# Patient Record
Sex: Female | Born: 1970 | Race: White | Hispanic: No | Marital: Married | Smoking: Current every day smoker
Health system: Southern US, Community
[De-identification: ages and names within clinical notes are randomized; demographics above are authoritative.]

## PROBLEM LIST (undated history)

## (undated) ENCOUNTER — Ambulatory Visit: Admission: EM

## (undated) DIAGNOSIS — F4310 Post-traumatic stress disorder, unspecified: Secondary | ICD-10-CM

## (undated) DIAGNOSIS — F329 Major depressive disorder, single episode, unspecified: Secondary | ICD-10-CM

## (undated) DIAGNOSIS — F419 Anxiety disorder, unspecified: Secondary | ICD-10-CM

## (undated) DIAGNOSIS — F32A Depression, unspecified: Secondary | ICD-10-CM

## (undated) DIAGNOSIS — F319 Bipolar disorder, unspecified: Secondary | ICD-10-CM

## (undated) HISTORY — PX: ABDOMINAL SURGERY: SHX537

## (undated) HISTORY — PX: CHOLECYSTECTOMY: SHX55

## (undated) HISTORY — PX: KNEE SURGERY: SHX244

---

## 2005-02-25 ENCOUNTER — Emergency Department: Payer: Self-pay | Admitting: Internal Medicine

## 2005-10-05 ENCOUNTER — Emergency Department: Payer: Self-pay | Admitting: Emergency Medicine

## 2005-11-27 ENCOUNTER — Emergency Department: Payer: Self-pay | Admitting: Emergency Medicine

## 2006-03-26 ENCOUNTER — Emergency Department: Payer: Self-pay | Admitting: Emergency Medicine

## 2006-06-20 ENCOUNTER — Emergency Department: Payer: Self-pay | Admitting: Emergency Medicine

## 2006-06-25 ENCOUNTER — Emergency Department: Payer: Self-pay | Admitting: Emergency Medicine

## 2007-07-26 ENCOUNTER — Ambulatory Visit: Payer: Self-pay | Admitting: Family Medicine

## 2007-12-30 ENCOUNTER — Ambulatory Visit: Payer: Self-pay | Admitting: Family Medicine

## 2008-01-22 ENCOUNTER — Ambulatory Visit: Payer: Self-pay | Admitting: Family Medicine

## 2008-02-03 ENCOUNTER — Ambulatory Visit: Payer: Self-pay | Admitting: Gastroenterology

## 2008-02-07 ENCOUNTER — Ambulatory Visit: Payer: Self-pay | Admitting: Gastroenterology

## 2009-06-29 ENCOUNTER — Ambulatory Visit: Payer: Self-pay | Admitting: Internal Medicine

## 2010-01-21 ENCOUNTER — Ambulatory Visit: Payer: Self-pay | Admitting: Family Medicine

## 2011-06-13 ENCOUNTER — Emergency Department: Payer: Self-pay | Admitting: Emergency Medicine

## 2011-06-23 ENCOUNTER — Ambulatory Visit: Payer: Self-pay | Admitting: Physician Assistant

## 2011-07-04 ENCOUNTER — Ambulatory Visit: Payer: Self-pay | Admitting: Orthopedic Surgery

## 2011-09-04 ENCOUNTER — Ambulatory Visit: Payer: Self-pay | Admitting: Orthopedic Surgery

## 2011-09-19 ENCOUNTER — Ambulatory Visit: Payer: Self-pay | Admitting: Internal Medicine

## 2011-11-09 ENCOUNTER — Ambulatory Visit: Payer: Self-pay

## 2012-01-12 ENCOUNTER — Emergency Department: Payer: Self-pay | Admitting: Emergency Medicine

## 2012-01-12 LAB — DRUG SCREEN, URINE
Amphetamines, Ur Screen: NEGATIVE (ref ?–1000)
Barbiturates, Ur Screen: POSITIVE (ref ?–200)
Benzodiazepine, Ur Scrn: POSITIVE (ref ?–200)
Cocaine Metabolite,Ur ~~LOC~~: POSITIVE (ref ?–300)
MDMA (Ecstasy)Ur Screen: NEGATIVE (ref ?–500)
Methadone, Ur Screen: NEGATIVE (ref ?–300)
Tricyclic, Ur Screen: NEGATIVE (ref ?–1000)

## 2012-01-12 LAB — ACETAMINOPHEN LEVEL: Acetaminophen: <2 ug/mL

## 2012-01-12 LAB — TSH: Thyroid Stimulating Horm: 0.77 u[IU]/mL

## 2012-01-12 LAB — ETHANOL: Ethanol %: <0.003 % (ref 0.000–0.080)

## 2012-01-12 LAB — COMPREHENSIVE METABOLIC PANEL
Alkaline Phosphatase: 49 U/L — ABNORMAL LOW (ref 50–136)
Anion Gap: 6 — ABNORMAL LOW (ref 7–16)
Calcium, Total: 8.8 mg/dL (ref 8.5–10.1)
Co2: 27 mmol/L (ref 21–32)
EGFR (African American): >60
Osmolality: 275 (ref 275–301)
Potassium: 3.5 mmol/L (ref 3.5–5.1)
SGOT(AST): 17 U/L (ref 15–37)
Sodium: 138 mmol/L (ref 136–145)

## 2012-01-12 LAB — CBC
Platelet: 200 10*3/uL (ref 150–440)
RDW: 13.6 % (ref 11.5–14.5)
WBC: 8.3 10*3/uL (ref 3.6–11.0)

## 2012-01-12 LAB — SALICYLATE LEVEL: Salicylates, Serum: 3.9 mg/dL — ABNORMAL HIGH

## 2012-03-14 ENCOUNTER — Ambulatory Visit: Payer: Self-pay

## 2012-07-05 ENCOUNTER — Ambulatory Visit: Payer: Self-pay | Admitting: Internal Medicine

## 2012-12-11 ENCOUNTER — Emergency Department: Payer: Self-pay | Admitting: Emergency Medicine

## 2013-03-28 ENCOUNTER — Ambulatory Visit: Payer: Self-pay | Admitting: Family Medicine

## 2013-03-28 LAB — COMPREHENSIVE METABOLIC PANEL
Albumin: 4.9 g/dL (ref 3.4–5.0)
Alkaline Phosphatase: 74 U/L (ref 50–136)
BUN: 10 mg/dL (ref 7–18)
Calcium, Total: 9.6 mg/dL (ref 8.5–10.1)
Creatinine: 0.73 mg/dL (ref 0.60–1.30)
EGFR (African American): >60
Osmolality: 275 (ref 275–301)
Potassium: 4.7 mmol/L (ref 3.5–5.1)
SGOT(AST): 12 U/L — ABNORMAL LOW (ref 15–37)
Total Protein: 8.1 g/dL (ref 6.4–8.2)

## 2013-03-28 LAB — CBC WITH DIFFERENTIAL/PLATELET
Basophil %: 1.5 %
Eosinophil %: 1.1 %
HCT: 47.9 % — ABNORMAL HIGH (ref 35.0–47.0)
HGB: 16.2 g/dL — ABNORMAL HIGH (ref 12.0–16.0)
Lymphocyte #: 2.9 10*3/uL (ref 1.0–3.6)
Lymphocyte %: 32 %
MCH: 30.1 pg (ref 26.0–34.0)
MCHC: 33.8 g/dL (ref 32.0–36.0)
MCV: 89 fL (ref 80–100)
Monocyte #: 0.5 x10 3/mm (ref 0.2–0.9)
Monocyte %: 5.3 %
Neutrophil %: 60.1 %
RBC: 5.38 10*6/uL — ABNORMAL HIGH (ref 3.80–5.20)
RDW: 12.8 % (ref 11.5–14.5)
WBC: 9 10*3/uL (ref 3.6–11.0)

## 2013-03-28 LAB — SEDIMENTATION RATE: Erythrocyte Sed Rate: 2 mm/hr (ref 0–20)

## 2013-07-07 ENCOUNTER — Ambulatory Visit: Payer: Self-pay

## 2013-07-09 ENCOUNTER — Emergency Department: Payer: Self-pay | Admitting: Emergency Medicine

## 2013-07-09 LAB — URINALYSIS, COMPLETE
Nitrite: NEGATIVE
Protein: 30

## 2013-07-09 LAB — DRUG SCREEN, URINE
Benzodiazepine, Ur Scrn: POSITIVE (ref ?–200)
Cannabinoid 50 Ng, Ur ~~LOC~~: NEGATIVE (ref ?–50)
MDMA (Ecstasy)Ur Screen: NEGATIVE (ref ?–500)
Methadone, Ur Screen: NEGATIVE (ref ?–300)
Opiate, Ur Screen: POSITIVE (ref ?–300)

## 2013-07-09 LAB — CBC
HCT: 37.7 % (ref 35.0–47.0)
HGB: 12.9 g/dL (ref 12.0–16.0)
MCH: 30.6 pg (ref 26.0–34.0)
MCHC: 34.3 g/dL (ref 32.0–36.0)
Platelet: 185 10*3/uL (ref 150–440)
RBC: 4.22 10*6/uL (ref 3.80–5.20)
WBC: 8.6 10*3/uL (ref 3.6–11.0)

## 2013-07-09 LAB — COMPREHENSIVE METABOLIC PANEL
Albumin: 3.3 g/dL — ABNORMAL LOW (ref 3.4–5.0)
Anion Gap: 4 — ABNORMAL LOW (ref 7–16)
Bilirubin,Total: 0.3 mg/dL (ref 0.2–1.0)
Chloride: 105 mmol/L (ref 98–107)
Glucose: 89 mg/dL (ref 65–99)
Potassium: 3.5 mmol/L (ref 3.5–5.1)
SGOT(AST): 347 U/L — ABNORMAL HIGH (ref 15–37)
SGPT (ALT): 141 U/L — ABNORMAL HIGH (ref 12–78)
Sodium: 139 mmol/L (ref 136–145)

## 2013-10-06 ENCOUNTER — Ambulatory Visit: Payer: Self-pay | Admitting: Physician Assistant

## 2013-10-06 LAB — RAPID STREP-A WITH REFLX: Micro Text Report: NEGATIVE

## 2013-10-10 LAB — BETA STREP CULTURE(ARMC)

## 2014-02-01 ENCOUNTER — Emergency Department: Payer: Self-pay | Admitting: Emergency Medicine

## 2014-02-01 LAB — COMPREHENSIVE METABOLIC PANEL
ALT: 14 U/L (ref 12–78)
Albumin: 3.9 g/dL (ref 3.4–5.0)
Alkaline Phosphatase: 68 U/L
Anion Gap: 4 — ABNORMAL LOW (ref 7–16)
BUN: 7 mg/dL (ref 7–18)
Bilirubin,Total: 0.1 mg/dL — ABNORMAL LOW (ref 0.2–1.0)
CALCIUM: 8.6 mg/dL (ref 8.5–10.1)
CREATININE: 0.77 mg/dL (ref 0.60–1.30)
Chloride: 107 mmol/L (ref 98–107)
Co2: 28 mmol/L (ref 21–32)
EGFR (Non-African Amer.): >60
GLUCOSE: 124 mg/dL — AB (ref 65–99)
Osmolality: 277 (ref 275–301)
POTASSIUM: 4.1 mmol/L (ref 3.5–5.1)
SGOT(AST): 20 U/L (ref 15–37)
SODIUM: 139 mmol/L (ref 136–145)
TOTAL PROTEIN: 7.3 g/dL (ref 6.4–8.2)

## 2014-02-01 LAB — URINALYSIS, COMPLETE
BILIRUBIN, UR: NEGATIVE
Bacteria: NONE SEEN
GLUCOSE, UR: NEGATIVE mg/dL (ref 0–75)
Ketone: NEGATIVE
LEUKOCYTE ESTERASE: NEGATIVE
NITRITE: NEGATIVE
PROTEIN: NEGATIVE
Ph: 6 (ref 4.5–8.0)
RBC,UR: 5 /HPF (ref 0–5)
Specific Gravity: 1.017 (ref 1.003–1.030)
Squamous Epithelial: <1

## 2014-02-01 LAB — CBC
HCT: 41 % (ref 35.0–47.0)
HGB: 13.5 g/dL (ref 12.0–16.0)
MCH: 30 pg (ref 26.0–34.0)
MCHC: 32.8 g/dL (ref 32.0–36.0)
MCV: 91 fL (ref 80–100)
PLATELETS: 228 10*3/uL (ref 150–440)
RBC: 4.49 10*6/uL (ref 3.80–5.20)
RDW: 13.9 % (ref 11.5–14.5)
WBC: 9.4 10*3/uL (ref 3.6–11.0)

## 2014-02-01 LAB — DRUG SCREEN, URINE
Amphetamines, Ur Screen: NEGATIVE (ref ?–1000)
BENZODIAZEPINE, UR SCRN: POSITIVE (ref ?–200)
Barbiturates, Ur Screen: POSITIVE (ref ?–200)
CANNABINOID 50 NG, UR ~~LOC~~: NEGATIVE (ref ?–50)
Cocaine Metabolite,Ur ~~LOC~~: POSITIVE (ref ?–300)
MDMA (Ecstasy)Ur Screen: NEGATIVE (ref ?–500)
Methadone, Ur Screen: NEGATIVE (ref ?–300)
Opiate, Ur Screen: POSITIVE (ref ?–300)
PHENCYCLIDINE (PCP) UR S: NEGATIVE (ref ?–25)
TRICYCLIC, UR SCREEN: NEGATIVE (ref ?–1000)

## 2014-02-01 LAB — SALICYLATE LEVEL: Salicylates, Serum: 5.3 mg/dL — ABNORMAL HIGH

## 2014-02-01 LAB — ACETAMINOPHEN LEVEL: Acetaminophen: 13 ug/mL

## 2014-02-01 LAB — ETHANOL
Ethanol %: <0.003 % (ref 0.000–0.080)
Ethanol: <3 mg/dL

## 2014-03-25 ENCOUNTER — Ambulatory Visit: Payer: Self-pay | Admitting: Internal Medicine

## 2014-04-14 ENCOUNTER — Ambulatory Visit: Payer: Self-pay | Admitting: Emergency Medicine

## 2015-02-04 ENCOUNTER — Emergency Department
Admit: 2015-02-04 | Disposition: A | Discharge status: Home / Self Care | Payer: Self-pay | Admitting: Emergency Medicine

## 2015-02-04 LAB — DRUG SCREEN, URINE
Amphetamines, Ur Screen: NEGATIVE
Barbiturates, Ur Screen: NEGATIVE
Benzodiazepine, Ur Scrn: POSITIVE
Cannabinoid 50 Ng, Ur ~~LOC~~: NEGATIVE
Cocaine Metabolite,Ur ~~LOC~~: POSITIVE
MDMA (Ecstasy)Ur Screen: NEGATIVE
Methadone, Ur Screen: NEGATIVE
Opiate, Ur Screen: POSITIVE
Phencyclidine (PCP) Ur S: NEGATIVE
Tricyclic, Ur Screen: POSITIVE

## 2015-02-04 LAB — COMPREHENSIVE METABOLIC PANEL
AST: 18 U/L
Albumin: 4.8 g/dL
Alkaline Phosphatase: 65 U/L
Anion Gap: 8 (ref 7–16)
BILIRUBIN TOTAL: 0.3 mg/dL
BUN: 9 mg/dL
CHLORIDE: 105 mmol/L
Calcium, Total: 8.8 mg/dL — ABNORMAL LOW
Co2: 28 mmol/L
Creatinine: 0.77 mg/dL
EGFR (African American): >60
Glucose: 113 mg/dL — ABNORMAL HIGH
Potassium: 4 mmol/L
SGPT (ALT): 13 U/L — ABNORMAL LOW
SODIUM: 141 mmol/L
TOTAL PROTEIN: 7.6 g/dL

## 2015-02-04 LAB — URINALYSIS, COMPLETE
Bilirubin,UR: NEGATIVE
Blood: NEGATIVE
GLUCOSE, UR: NEGATIVE mg/dL (ref 0–75)
Ketone: NEGATIVE
LEUKOCYTE ESTERASE: NEGATIVE
NITRITE: NEGATIVE
Ph: 6 (ref 4.5–8.0)
Protein: 30
Specific Gravity: 1.02 (ref 1.003–1.030)

## 2015-02-04 LAB — CBC
HCT: 43.2 % (ref 35.0–47.0)
HGB: 14.3 g/dL (ref 12.0–16.0)
MCH: 30 pg (ref 26.0–34.0)
MCHC: 33 g/dL (ref 32.0–36.0)
MCV: 91 fL (ref 80–100)
Platelet: 216 10*3/uL (ref 150–440)
RBC: 4.75 10*6/uL (ref 3.80–5.20)
RDW: 13.6 % (ref 11.5–14.5)
WBC: 8.6 10*3/uL (ref 3.6–11.0)

## 2015-02-04 LAB — ACETAMINOPHEN LEVEL: Acetaminophen: <10 ug/mL

## 2015-02-04 LAB — ETHANOL: Ethanol: <5 mg/dL

## 2015-02-04 LAB — TSH: Thyroid Stimulating Horm: 1.02 u[IU]/mL

## 2015-02-04 LAB — SALICYLATE LEVEL: Salicylates, Serum: <4 mg/dL

## 2015-02-13 NOTE — Consult Note (Signed)
Brief Consult Note: Diagnosis: Major depressive disorder recurrent moderate.   Patient was seen by consultant.   Consult note dictated.   Recommend further assessment or treatment.   Comments: [name] has a h/o depression and anxiety. She is in the care od PCP at [facility] who prescribes Paxil and clonazepam. She admits that she has problems with "pills". She took some pills when upset with her husband who has an affair. She is not suicidal or homicidal. She is cool and collected.   PLAN: 1. The patient no longer meets criteria for IVC. I will terminate proceedings. Please discharge as appropriate.  2. She is to continue Paxil as prescribed by her provider. No Rx necessary.  3. She will follow up with Dr. Marianna FussMichael Larter [phone] for therapy.  Electronic Signatures: Kristine LineaPucilowska, Victorina Kable (MD)  (Signed 13-Apr-15 13:47)  Authored: Brief Consult Note   Last Updated: 13-Apr-15 13:47 by Kristine LineaPucilowska, Terrika Zuver (MD)
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
## 2015-02-21 NOTE — Consult Note (Addendum)
PATIENT NAME:  Molly Proctor, Molly Proctor MR#:  [id number] DATE OF BIRTH:  1971/01/28  DATE OF CONSULTATION:  02/05/2015  CONSULTING PHYSICIAN:  Audery AmelJohn T. Amor Hyle, MD  IDENTIFYING INFORMATION AND REASON FOR CONSULT:  A 44 year old woman with a history of depression and substance abuse came into the hospital after taking what appeared to be an overdose.   CHIEF COMPLAINT:  "I don't remember."   HISTORY OF PRESENT ILLNESS:  Information is from the patient and the chart.  I attempted to see her yesterday, but she was so sedated, I could get only a little bit of information from her. When I saw her yesterday, she did admit that she had taken an overdose of pills.  Today she claims she cannot remember at all why she is in the Emergency Room.  When I pointed out that she had admitted to taking an overdose of pills, she says that was probably right, but she still cannot remember it.  She says she figures she probably took some of her Klonopin.  She has no justification for it except to say that sometimes you just feel like taking some pills.  She says that her mood recently has been feeling a little bit down and depressed.  She has been sleeping somewhat erratically.  She totally denies however having any suicidal intention, plan, or wish. Denies homicidal ideation.  She denies that she has been having any psychotic symptoms.  Says that she has been compliant with her medicine as prescribed by her primary [facility] at [facility] which is a combination of Paxil, Tegretol and Klonopin.  The patient just got out of prison and is living in a mobile home.  Not working.  It sounds like she is still at a lot of stress on her.  She admits that she does cocaine intermittently.  Despite all of this, she tells me that she does not see herself as having any kind of substance abuse problem.  She has not reported suicidal ideation.   PAST PSYCHIATRIC HISTORY:  Interestingly, it was exactly a year ago that she presented to the Emergency Room  under identical circumstances having overdosed on what appeared to be a combination of pills.  The patient does not know of any anniversary reason why this would be happening.  She apparently does not engage herself in treatment of substance abuse but only seeing a doctor for treatment of her depression.  She denies suicide attempts.  Denies psychotic symptoms.   SOCIAL HISTORY:  Just out of prison.  Living in a trailer.  Not working.  It sounds like she has minimal support and a lot of stress.   PAST MEDICAL HISTORY:  The patient says she has headaches and takes Fioricet for them as needed.  Denies other ongoing medical problems.   FAMILY HISTORY:  She says there are multiple family members with bipolar disorder.   CURRENT MEDICATIONS:  Paxil, Tegretol and Klonopin all at unknown doses to her.   ALLERGIES: No known drug allergies.   REVIEW OF SYSTEMS:  Currently, she has a mild headache. Does not have any other pain.  Denies GI, cardiac, or pulmonary problems.  Denies feeling depressed.  Denies suicidal ideation or hallucinations.   MENTAL STATUS EXAMINATION: Very dirty and disheveled woman who looks older than her stated age.  Passively cooperative.  Eye contact intermittent.  Psychomotor activity is sluggish.  Speech is decreased in amount and quiet.  Affect flat.  Mood stated as okay.  Thoughts appear slow in lucid but  no sign of delusions or loosening of association.  Denies auditory or visual hallucinations.  Denies suicidal or homicidal ideation.  She is alert and oriented x 4. Repeats three words immediately, remembers two of them at three minutes.  Judgment and insight impaired about her substance abuse.  Intelligence normal.   EKG, LABORATORY AND IMAGING RESULTS:  EKG was unremarkable.  Acetaminophen level negative.  Glucose slightly elevated at 113, ALT low at 13.  The rest of the chemistry panel normal.  Alcohol negative.  CBC normal.  TSH normal.  Drug screen was positive for tricyclics,  cocaine, opiates, and for benzodiazepines.  Urinalysis unremarkable.  Head CT normal.   VITAL SIGNS:  Blood pressure is currently 127/94, respirations 18, pulse 82, temperature 98.8.   ASSESSMENT:  A 44 year old woman with a history of depression and a history of overdoses and clearly history of active ongoing substance abuse although she has no insight about it.  She took an overdose yesterday but never admitted to any suicidal intent.  Currently is denying suicidal ideation.  She is not psychotic.  She is minimizing depressive symptoms.  No clear necessary indication for hospital level treatment.   TREATMENT PLAN:  Discontinue involuntary commitment.  Psychoeducation and supportive counseling done regarding her substance abuse.  Strongly encouraged her to talk to her primary doctor about getting off of any prescribed abusable medicines including Fioricet and Klonopin. Strongly encouraged her to go to substance abuse treatment and take it seriously if she wants her mood to get better.  The case discussed with Emergency Room doctor.  No further treatment right now.   DIAGNOSIS, PRINCIPAL AND PRIMARY: AXIS I:  Delirium, secondary sedative intoxication now resolved.   SECONDARY DIAGNOSES:  1.  Sedative abuse.  2.  Opiate abuse.  3.  Cocaine abuse.   AXIS II:  Deferred.   AXIS III:  No diagnosis.       ____________________________ Audery Amel, MD [id number] D: 02/05/2015 14:25:35 ET T: 02/05/2015 14:37:47 ET JOB#: [id number]  cc: Audery Amel, MD, <Dictator> Audery Amel MD ELECTRONICALLY SIGNED 02/26/2015 19:27

## 2015-04-02 ENCOUNTER — Emergency Department
Admission: EM | Admit: 2015-04-02 | Discharge: 2015-04-02 | Disposition: A | Discharge status: Home / Self Care | Attending: Emergency Medicine | Admitting: Emergency Medicine

## 2015-04-02 ENCOUNTER — Encounter: Payer: Self-pay | Admitting: Emergency Medicine

## 2015-04-02 DIAGNOSIS — G8929 Other chronic pain: Secondary | ICD-10-CM | POA: Insufficient documentation

## 2015-04-02 DIAGNOSIS — M2550 Pain in unspecified joint: Secondary | ICD-10-CM | POA: Insufficient documentation

## 2015-04-02 DIAGNOSIS — Z720 Tobacco use: Secondary | ICD-10-CM | POA: Insufficient documentation

## 2015-04-02 HISTORY — DX: Depression, unspecified: F32.A

## 2015-04-02 HISTORY — DX: Major depressive disorder, single episode, unspecified: F32.9

## 2015-04-02 LAB — CBC WITH DIFFERENTIAL/PLATELET
Basophils Absolute: 0.1 10*3/uL (ref 0–0.1)
Basophils Relative: 1 %
EOS ABS: 0.2 10*3/uL (ref 0–0.7)
EOS PCT: 2 %
HEMATOCRIT: 42.3 % (ref 35.0–47.0)
Hemoglobin: 14.5 g/dL (ref 12.0–16.0)
Lymphocytes Relative: 35 %
Lymphs Abs: 3 10*3/uL (ref 1.0–3.6)
MCH: 31.1 pg (ref 26.0–34.0)
MCHC: 34.4 g/dL (ref 32.0–36.0)
MCV: 90.6 fL (ref 80.0–100.0)
MONO ABS: 0.3 10*3/uL (ref 0.2–0.9)
MONOS PCT: 4 %
NEUTROS PCT: 58 %
Neutro Abs: 5.1 10*3/uL (ref 1.4–6.5)
Platelets: 229 10*3/uL (ref 150–440)
RBC: 4.67 MIL/uL (ref 3.80–5.20)
RDW: 14 % (ref 11.5–14.5)
WBC: 8.6 10*3/uL (ref 3.6–11.0)

## 2015-04-02 LAB — COMPREHENSIVE METABOLIC PANEL
ALT: 12 U/L — ABNORMAL LOW (ref 14–54)
AST: 18 U/L (ref 15–41)
Albumin: 4.6 g/dL (ref 3.5–5.0)
Alkaline Phosphatase: 58 U/L (ref 38–126)
Anion gap: 8 (ref 5–15)
BILIRUBIN TOTAL: 0.4 mg/dL (ref 0.3–1.2)
BUN: 11 mg/dL (ref 6–20)
CHLORIDE: 105 mmol/L (ref 101–111)
CO2: 28 mmol/L (ref 22–32)
CREATININE: 0.87 mg/dL (ref 0.44–1.00)
Calcium: 9.1 mg/dL (ref 8.9–10.3)
GFR calc Af Amer: >60 mL/min (ref >60)
GLUCOSE: 97 mg/dL (ref 65–99)
Potassium: 3.1 mmol/L — ABNORMAL LOW (ref 3.5–5.1)
Sodium: 141 mmol/L (ref 135–145)
Total Protein: 7.3 g/dL (ref 6.5–8.1)

## 2015-04-02 MED ORDER — KETOROLAC TROMETHAMINE 60 MG/2ML IM SOLN
INTRAMUSCULAR | Status: AC
  Administered 2015-04-02: 60 mg via INTRAMUSCULAR
  Filled 2015-04-02: qty 2

## 2015-04-02 MED ORDER — KETOROLAC TROMETHAMINE 60 MG/2ML IM SOLN
60.0000 mg | Freq: Once | INTRAMUSCULAR | Status: AC
  Administered 2015-04-02: 60 mg via INTRAMUSCULAR

## 2015-04-02 MED ORDER — IBUPROFEN 800 MG PO TABS: 800.0000 mg | ORAL_TABLET | Freq: Three times a day (TID) | ORAL | Status: DC | PRN

## 2015-04-02 NOTE — ED Notes (Signed)
Pt to ed with c/o joint pain x 1 1/2 weeks, also reports increased bruising noted over her body.  Pt appears in nad at this time.  Pt alert and oriented.  Pt states "everything is just killing me"

## 2015-04-02 NOTE — ED Provider Notes (Signed)
[facility] Emergency Department Provider Note     Time seen: ----------------------------------------- 7:49 AM on 04/02/2015 -----------------------------------------    I have reviewed the triage vital signs and the nursing notes.   HISTORY  Chief Complaint Joint Pain    HPI Molly Proctor is a 44 y.o. female since ER for joint pain for last week and half. Patient reports joint pains for years, up to 3 years ago. Patient states everyone in her family has some type of arthritis, she's never been formally diagnosed with any specific type of arthritis. She denies fevers chills chest pain, shortness of breath, nausea vomiting or diarrhea. She states she's never been prescribed medications for this joint pain. Patient said he was seen years ago by Dr. Kateri Mc but not given a diagnosis. Pain is severe, nothing makes it better or worse   Past Medical History  Diagnosis Date  . Depression     There are no active problems to display for this patient.   Past Surgical History  Procedure Laterality Date  . Knee surgery      Allergies Review of patient's allergies indicates no known allergies.  Social History History  Substance Use Topics  . Smoking status: Current Every Day Smoker  . Smokeless tobacco: Not on file  . Alcohol Use: No    Review of Systems Constitutional: Negative for fever. Eyes: Negative for visual changes. ENT: Negative for sore throat. Cardiovascular: Negative for chest pain. Respiratory: Negative for shortness of breath. Gastrointestinal: Negative for abdominal pain, vomiting and diarrhea. Genitourinary: Negative for dysuria. Musculoskeletal: Negative for back pain. Diffuse bilateral joint pain in all large joints Skin: Negative for rash. Neurological: Negative for headaches, focal weakness or numbness.  10-point ROS otherwise negative.  ____________________________________________   PHYSICAL EXAM:  VITAL SIGNS: ED Triage  Vitals  Enc Vitals Group     BP 04/02/15 0716 123/79 mmHg     Pulse Rate 04/02/15 0716 84     Resp 04/02/15 0716 18     Temp 04/02/15 0716 98.2 F (36.8 C)     Temp Source 04/02/15 0716 Oral     SpO2 04/02/15 0716 98 %     Weight 04/02/15 0716 156 lb (70.761 kg)     Height 04/02/15 0716  (1.676 m)     Head Cir --      Peak Flow --      Pain Score 04/02/15 0716 8     Pain Loc --      Pain Edu? --      Excl. in GC? --     Constitutional: Alert and oriented. Well appearing and in no distress. Eyes: Conjunctivae are normal. PERRL. Normal extraocular movements. ENT   Head: Normocephalic and atraumatic.   Nose: No congestion/rhinnorhea.   Mouth/Throat: Mucous membranes are moist.   Neck: No stridor. Hematological/Lymphatic/Immunilogical: No cervical lymphadenopathy. Cardiovascular: Normal rate, regular rhythm. Normal and symmetric distal pulses are present in all extremities. No murmurs, rubs, or gallops. Respiratory: Normal respiratory effort without tachypnea nor retractions. Breath sounds are clear and equal bilaterally. No wheezes/rales/rhonchi. Gastrointestinal: Soft and nontender. No distention. No abdominal bruits. There is no CVA tenderness. Musculoskeletal: Nontender with normal range of motion in all extremities. No joint effusions.  No lower extremity tenderness nor edema. Neurologic:  Normal speech and language. No gross focal neurologic deficits are appreciated. Speech is normal. No gait instability. Skin:  Skin is warm, dry and intact. No rash noted. Psychiatric: Mood and affect are normal. Speech and  behavior are normal. Patient exhibits appropriate insight and judgment. ____________________________________________  ED COURSE:  Pertinent labs & imaging results that were available during my care of the patient were reviewed by me and considered in my medical decision making (see chart for details). I will check basic labs including rheumatoid factor and  antinuclear antibody ____________________________________________    LABS (pertinent positives/negatives)  Labs Reviewed  COMPREHENSIVE METABOLIC PANEL - Abnormal; Notable for the following:    Potassium 3.1 (*)    ALT 12 (*)    All other components within normal limits  CBC WITH DIFFERENTIAL/PLATELET  URINALYSIS COMPLETEWITH MICROSCOPIC ([facility] ONLY)  RHEUMATOID FACTOR  ANTINUCLEAR ANTIBODIES, IFA    RADIOLOGY  None  ____________________________________________  FINAL ASSESSMENT AND PLAN  Arthralgia  Plan: Patient presents with chronic symptoms, advised we do not manage chronic pain in the ER. Encouraged Motrin as needed for symptoms. She stable for outpatient follow-up.   Emily Filbert, MD   Emily Filbert, MD 04/02/15 [phone]

## 2015-04-02 NOTE — Discharge Instructions (Signed)

## 2015-04-02 NOTE — ED Notes (Signed)
Pt found laying in room 12 when she is suppose to be in 11.the patient is a/ox3.the patient returned to rm11. Informed of waiting for results.

## 2015-04-04 LAB — RHEUMATOID FACTOR: RHEUMATOID FACTOR: 9.2 [IU]/mL (ref 0.0–13.9)

## 2015-04-04 LAB — ANTINUCLEAR ANTIBODIES, IFA: ANA Ab, IFA: NEGATIVE

## 2015-06-29 ENCOUNTER — Ambulatory Visit: Admit: 2015-06-29

## 2015-06-29 ENCOUNTER — Encounter: Payer: Self-pay | Admitting: Emergency Medicine

## 2015-06-29 ENCOUNTER — Ambulatory Visit
Admission: EM | Admit: 2015-06-29 | Discharge: 2015-06-29 | Disposition: A | Discharge status: Home / Self Care | Attending: Family Medicine | Admitting: Family Medicine

## 2015-06-29 DIAGNOSIS — A499 Bacterial infection, unspecified: Secondary | ICD-10-CM

## 2015-06-29 DIAGNOSIS — N760 Acute vaginitis: Secondary | ICD-10-CM | POA: Diagnosis not present

## 2015-06-29 DIAGNOSIS — Z9889 Other specified postprocedural states: Secondary | ICD-10-CM

## 2015-06-29 DIAGNOSIS — B373 Candidiasis of vulva and vagina: Secondary | ICD-10-CM | POA: Diagnosis not present

## 2015-06-29 DIAGNOSIS — N730 Acute parametritis and pelvic cellulitis: Secondary | ICD-10-CM | POA: Diagnosis not present

## 2015-06-29 DIAGNOSIS — B9689 Other specified bacterial agents as the cause of diseases classified elsewhere: Secondary | ICD-10-CM

## 2015-06-29 DIAGNOSIS — B3731 Acute candidiasis of vulva and vagina: Secondary | ICD-10-CM

## 2015-06-29 HISTORY — DX: Post-traumatic stress disorder, unspecified: F43.10

## 2015-06-29 HISTORY — DX: Anxiety disorder, unspecified: F41.9

## 2015-06-29 HISTORY — DX: Bipolar disorder, unspecified: F31.9

## 2015-06-29 LAB — URINALYSIS COMPLETE WITH MICROSCOPIC (ARMC ONLY)
Bacteria, UA: NONE SEEN — AB
Bilirubin Urine: NEGATIVE
GLUCOSE, UA: NEGATIVE mg/dL
Ketones, ur: NEGATIVE mg/dL
LEUKOCYTES UA: NEGATIVE
Nitrite: NEGATIVE
Protein, ur: NEGATIVE mg/dL
SPECIFIC GRAVITY, URINE: 1.02 (ref 1.005–1.030)
pH: 6 (ref 5.0–8.0)

## 2015-06-29 LAB — CBC WITH DIFFERENTIAL/PLATELET
Basophils Absolute: 0.1 10*3/uL (ref 0–0.1)
Basophils Relative: 1 %
Eosinophils Absolute: 0.1 10*3/uL (ref 0–0.7)
Eosinophils Relative: 2 %
HEMATOCRIT: 42.5 % (ref 35.0–47.0)
HEMOGLOBIN: 14.6 g/dL (ref 12.0–16.0)
LYMPHS ABS: 3.2 10*3/uL (ref 1.0–3.6)
LYMPHS PCT: 42 %
MCH: 32.1 pg (ref 26.0–34.0)
MCHC: 34.4 g/dL (ref 32.0–36.0)
MCV: 93.3 fL (ref 80.0–100.0)
MONOS PCT: 6 %
Monocytes Absolute: 0.4 10*3/uL (ref 0.2–0.9)
NEUTROS ABS: 3.7 10*3/uL (ref 1.4–6.5)
NEUTROS PCT: 49 %
Platelets: 240 10*3/uL (ref 150–440)
RBC: 4.55 MIL/uL (ref 3.80–5.20)
RDW: 13.3 % (ref 11.5–14.5)
WBC: 7.5 10*3/uL (ref 3.6–11.0)

## 2015-06-29 LAB — COMPREHENSIVE METABOLIC PANEL
ALT: 15 U/L (ref 14–54)
ANION GAP: 10 (ref 5–15)
AST: 19 U/L (ref 15–41)
Albumin: 4.9 g/dL (ref 3.5–5.0)
Alkaline Phosphatase: 61 U/L (ref 38–126)
BUN: 12 mg/dL (ref 6–20)
CO2: 28 mmol/L (ref 22–32)
Calcium: 9.1 mg/dL (ref 8.9–10.3)
Chloride: 100 mmol/L — ABNORMAL LOW (ref 101–111)
Creatinine, Ser: 0.71 mg/dL (ref 0.44–1.00)
GFR calc non Af Amer: >60 mL/min (ref >60)
Glucose, Bld: 96 mg/dL (ref 65–99)
Potassium: 3.8 mmol/L (ref 3.5–5.1)
SODIUM: 138 mmol/L (ref 135–145)
Total Bilirubin: 0.6 mg/dL (ref 0.3–1.2)
Total Protein: 7.7 g/dL (ref 6.5–8.1)

## 2015-06-29 LAB — WET PREP, GENITAL
TRICH WET PREP: NONE SEEN
YEAST WET PREP: NONE SEEN

## 2015-06-29 LAB — PREGNANCY, URINE: PREG TEST UR: NEGATIVE

## 2015-06-29 MED ORDER — CEFTRIAXONE SODIUM 1 G IJ SOLR
1.0000 g | Freq: Once | INTRAMUSCULAR | Status: AC
  Administered 2015-06-29: 1 g via INTRAMUSCULAR

## 2015-06-29 MED ORDER — TERCONAZOLE 80 MG VA SUPP: 80.0000 mg | Freq: Every day | VAGINAL | Status: DC

## 2015-06-29 MED ORDER — METRONIDAZOLE 500 MG PO TABS: 500.0000 mg | ORAL_TABLET | Freq: Two times a day (BID) | ORAL | Status: DC

## 2015-06-29 MED ORDER — DOXYCYCLINE HYCLATE 100 MG PO CAPS: 100.0000 mg | ORAL_CAPSULE | Freq: Two times a day (BID) | ORAL | Status: DC

## 2015-06-29 MED ORDER — KETOROLAC TROMETHAMINE 60 MG/2ML IM SOLN
60.0000 mg | Freq: Once | INTRAMUSCULAR | Status: AC
  Administered 2015-06-29: 60 mg via INTRAMUSCULAR

## 2015-06-29 MED ORDER — HYDROCODONE-ACETAMINOPHEN 5-325 MG PO TABS: 1.0000 | ORAL_TABLET | Freq: Three times a day (TID) | ORAL | Status: DC | PRN
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
## 2015-06-29 NOTE — ED Notes (Signed)
Patient scheduled for Pelvic US and Non OB Transvaginal US for today at [facility].
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
## 2015-06-29 NOTE — Discharge Instructions (Signed)
Bacterial Vaginosis Bacterial vaginosis is an infection of the vagina. It happens when too many of certain germs (bacteria) grow in the vagina. HOME CARE  Take your medicine as told by your doctor.  Finish your medicine even if you start to feel better.  Do not have sex until you finish your medicine and are better.  Tell your sex partner that you have an infection. They should see their doctor for treatment.  Practice safe sex. Use condoms. Have only one sex partner. GET HELP IF:  You are not getting better after 3 days of treatment.  You have more grey fluid (discharge) coming from your vagina than before.  You have more pain than before.  You have a fever. MAKE SURE YOU:   Understand these instructions.  Will watch your condition.  Will get help right away if you are not doing well or get worse. Document Released: 07/18/2008 Document Revised: 07/30/2013 Document Reviewed: 05/21/2013 [facility] Patient Information 2015 [place], [place]. This information is not intended to replace advice given to you by your health care provider. Make sure you discuss any questions you have with your health care provider.  Candidal Vulvovaginitis Candidal vulvovaginitis is an infection of the vagina and vulva. The vulva is the skin around the opening of the vagina. This may cause itching and discomfort in and around the vagina.  HOME CARE  Only take medicine as told by your doctor.  Do not have sex (intercourse) until the infection is healed or as told by your doctor.  Practice safe sex.  Tell your sex partner about your infection.  Do not douche or use tampons.  Wear cotton underwear. Do not wear tight pants or panty hose.  Eat yogurt. This may help treat and prevent yeast infections. GET HELP RIGHT AWAY IF:   You have a fever.  Your problems get worse during treatment or do not get better in 3 days.  You have discomfort, irritation, or itching in your vagina or vulva  area.  You have pain after sex.  You start to get belly (abdominal) pain. MAKE SURE YOU:  Understand these instructions.  Will watch your condition.  Will get help right away if you are not doing well or get worse. Document Released: 01/05/2009 Document Revised: 10/14/2013 Document Reviewed: 01/05/2009 [facility] Patient Information 2015 [place], [place]. This information is not intended to replace advice given to you by your health care provider. Make sure you discuss any questions you have with your health care provider.  Pelvic Inflammatory Disease Pelvic inflammatory disease (PID) is an infection in some or all of the female organs. PID can be in the uterus, ovaries, fallopian tubes, or the surrounding tissues inside the lower belly area (pelvis). HOME CARE   If given, take your antibiotic medicine as told. Finish them even if you start to feel better.  Only take medicine as told by your doctor.  Do not have sex (intercourse) until treatment is done or as told by your doctor.  Tell your sex partner if you have PID. Your partner may need to be treated.  Keep all doctor visits. GET HELP RIGHT AWAY IF:   You have a fever.  You have more belly (abdominal) or lower belly pain.  You have chills.  You have pain when you pee (urinate).  You are not better after 72 hours.  You have more fluid (discharge) coming from your vagina or fluid that is not normal.  You need pain medicine from your doctor.  You  throw up (vomit).  You cannot take your medicines.  Your partner has a sexually transmitted disease (STD). MAKE SURE YOU:   Understand these instructions.  Will watch your condition.  Will get help right away if you are not doing well or get worse. Document Released: 01/05/2009 Document Revised: 02/03/2013 Document Reviewed: 10/05/2011 [facility] Patient Information 2015 [place], [place]. This information is not intended to replace advice given to you by your health care  provider. Make sure you discuss any questions you have with your health care provider.  Monilial Vaginitis Vaginitis in a soreness, swelling and redness (inflammation) of the vagina and vulva. Monilial vaginitis is not a sexually transmitted infection. CAUSES  Yeast vaginitis is caused by yeast (candida) that is normally found in your vagina. With a yeast infection, the candida has overgrown in number to a point that upsets the chemical balance. SYMPTOMS   White, thick vaginal discharge.  Swelling, itching, redness and irritation of the vagina and possibly the lips of the vagina (vulva).  Burning or painful urination.  Painful intercourse. DIAGNOSIS  Things that may contribute to monilial vaginitis are:  Postmenopausal and virginal states.  Pregnancy.  Infections.  Being tired, sick or stressed, especially if you had monilial vaginitis in the past.  Diabetes. Good control will help lower the chance.  Birth control pills.  Tight fitting garments.  Using bubble bath, feminine sprays, douches or deodorant tampons.  Taking certain medications that kill germs (antibiotics).  Sporadic recurrence can occur if you become ill. TREATMENT  Your caregiver will give you medication.  There are several kinds of anti monilial vaginal creams and suppositories specific for monilial vaginitis. For recurrent yeast infections, use a suppository or cream in the vagina 2 times a week, or as directed.  Anti-monilial or steroid cream for the itching or irritation of the vulva may also be used. Get your caregiver's permission.  Painting the vagina with methylene blue solution may help if the monilial cream does not work.  Eating yogurt may help prevent monilial vaginitis. HOME CARE INSTRUCTIONS   Finish all medication as prescribed.  Do not have sex until treatment is completed or after your caregiver tells you it is okay.  Take warm sitz baths.  Do not douche.  Do not use tampons,  especially scented ones.  Wear cotton underwear.  Avoid tight pants and panty hose.  Tell your sexual partner that you have a yeast infection. They should go to their caregiver if they have symptoms such as mild rash or itching.  Your sexual partner should be treated as well if your infection is difficult to eliminate.  Practice safer sex. Use condoms.  Some vaginal medications cause latex condoms to fail. Vaginal medications that harm condoms are:  Cleocin cream.  Butoconazole (Femstat).  Terconazole (Terazol) vaginal suppository.  Miconazole (Monistat) (may be purchased over the counter). SEEK MEDICAL CARE IF:   You have a temperature by mouth above 102 F (38.9 C).  The infection is getting worse after 2 days of treatment.  The infection is not getting better after 3 days of treatment.  You develop blisters in or around your vagina.  You develop vaginal bleeding, and it is not your menstrual period.  You have pain when you urinate.  You develop intestinal problems.  You have pain with sexual intercourse. Document Released: 07/19/2005 Document Revised: 01/01/2012 Document Reviewed: 04/02/2009 [facility] Patient Information 2015 [place], [place]. This information is not intended to replace advice given to you by your health care provider.  Make sure you discuss any questions you have with your health care provider. ° °

## 2015-06-30 LAB — CHLAMYDIA/NGC RT PCR (ARMC ONLY)
Chlamydia Tr: NOT DETECTED
N GONORRHOEAE: NOT DETECTED

## 2015-12-14 ENCOUNTER — Emergency Department

## 2015-12-14 ENCOUNTER — Emergency Department
Admission: EM | Admit: 2015-12-14 | Discharge: 2015-12-15 | Disposition: A | Discharge status: Home / Self Care | Attending: Emergency Medicine | Admitting: Emergency Medicine

## 2015-12-14 DIAGNOSIS — F1310 Sedative, hypnotic or anxiolytic abuse, uncomplicated: Secondary | ICD-10-CM

## 2015-12-14 DIAGNOSIS — R462 Strange and inexplicable behavior: Secondary | ICD-10-CM

## 2015-12-14 DIAGNOSIS — Y9289 Other specified places as the place of occurrence of the external cause: Secondary | ICD-10-CM | POA: Insufficient documentation

## 2015-12-14 DIAGNOSIS — F4310 Post-traumatic stress disorder, unspecified: Secondary | ICD-10-CM | POA: Insufficient documentation

## 2015-12-14 DIAGNOSIS — F919 Conduct disorder, unspecified: Secondary | ICD-10-CM | POA: Insufficient documentation

## 2015-12-14 DIAGNOSIS — F1994 Other psychoactive substance use, unspecified with psychoactive substance-induced mood disorder: Secondary | ICD-10-CM

## 2015-12-14 DIAGNOSIS — F1314 Sedative, hypnotic or anxiolytic abuse with sedative, hypnotic or anxiolytic-induced mood disorder: Secondary | ICD-10-CM | POA: Insufficient documentation

## 2015-12-14 DIAGNOSIS — S82831A Other fracture of upper and lower end of right fibula, initial encounter for closed fracture: Secondary | ICD-10-CM | POA: Insufficient documentation

## 2015-12-14 DIAGNOSIS — F1414 Cocaine abuse with cocaine-induced mood disorder: Secondary | ICD-10-CM | POA: Insufficient documentation

## 2015-12-14 DIAGNOSIS — N289 Disorder of kidney and ureter, unspecified: Secondary | ICD-10-CM | POA: Insufficient documentation

## 2015-12-14 DIAGNOSIS — F19921 Other psychoactive substance use, unspecified with intoxication with delirium: Secondary | ICD-10-CM

## 2015-12-14 DIAGNOSIS — Y9389 Activity, other specified: Secondary | ICD-10-CM | POA: Insufficient documentation

## 2015-12-14 DIAGNOSIS — S82401A Unspecified fracture of shaft of right fibula, initial encounter for closed fracture: Secondary | ICD-10-CM

## 2015-12-14 DIAGNOSIS — T50905A Adverse effect of unspecified drugs, medicaments and biological substances, initial encounter: Secondary | ICD-10-CM

## 2015-12-14 DIAGNOSIS — F1110 Opioid abuse, uncomplicated: Secondary | ICD-10-CM

## 2015-12-14 DIAGNOSIS — F3130 Bipolar disorder, current episode depressed, mild or moderate severity, unspecified: Secondary | ICD-10-CM | POA: Insufficient documentation

## 2015-12-14 DIAGNOSIS — Y998 Other external cause status: Secondary | ICD-10-CM | POA: Insufficient documentation

## 2015-12-14 DIAGNOSIS — Z3202 Encounter for pregnancy test, result negative: Secondary | ICD-10-CM | POA: Insufficient documentation

## 2015-12-14 DIAGNOSIS — M25471 Effusion, right ankle: Secondary | ICD-10-CM

## 2015-12-14 DIAGNOSIS — F1114 Opioid abuse with opioid-induced mood disorder: Secondary | ICD-10-CM | POA: Insufficient documentation

## 2015-12-14 DIAGNOSIS — F1410 Cocaine abuse, uncomplicated: Secondary | ICD-10-CM

## 2015-12-14 LAB — COMPREHENSIVE METABOLIC PANEL
ALBUMIN: 4.3 g/dL (ref 3.5–5.0)
ALT: 16 U/L (ref 14–54)
AST: 18 U/L (ref 15–41)
Alkaline Phosphatase: 72 U/L (ref 38–126)
Anion gap: 9 (ref 5–15)
BILIRUBIN TOTAL: 0.5 mg/dL (ref 0.3–1.2)
BUN: 11 mg/dL (ref 6–20)
CO2: 27 mmol/L (ref 22–32)
Calcium: 9.4 mg/dL (ref 8.9–10.3)
Chloride: 104 mmol/L (ref 101–111)
Creatinine, Ser: 1.01 mg/dL — ABNORMAL HIGH (ref 0.44–1.00)
GFR calc Af Amer: >60 mL/min (ref >60)
GFR calc non Af Amer: >60 mL/min (ref >60)
GLUCOSE: 106 mg/dL — AB (ref 65–99)
POTASSIUM: 3.8 mmol/L (ref 3.5–5.1)
Sodium: 140 mmol/L (ref 135–145)
TOTAL PROTEIN: 7.3 g/dL (ref 6.5–8.1)

## 2015-12-14 LAB — CBC WITH DIFFERENTIAL/PLATELET
BASOS PCT: 1 %
Basophils Absolute: 0.1 10*3/uL (ref 0–0.1)
Eosinophils Absolute: 0.2 10*3/uL (ref 0–0.7)
Eosinophils Relative: 2 %
HEMATOCRIT: 40.3 % (ref 35.0–47.0)
Hemoglobin: 13.8 g/dL (ref 12.0–16.0)
LYMPHS PCT: 40 %
Lymphs Abs: 4.3 10*3/uL — ABNORMAL HIGH (ref 1.0–3.6)
MCH: 30.9 pg (ref 26.0–34.0)
MCHC: 34.3 g/dL (ref 32.0–36.0)
MCV: 90.3 fL (ref 80.0–100.0)
MONO ABS: 0.7 10*3/uL (ref 0.2–0.9)
MONOS PCT: 6 %
NEUTROS ABS: 5.5 10*3/uL (ref 1.4–6.5)
Neutrophils Relative %: 51 %
Platelets: 300 10*3/uL (ref 150–440)
RBC: 4.46 MIL/uL (ref 3.80–5.20)
RDW: 13.2 % (ref 11.5–14.5)
WBC: 10.7 10*3/uL (ref 3.6–11.0)

## 2015-12-14 LAB — SALICYLATE LEVEL: Salicylate Lvl: <4 mg/dL (ref 2.8–30.0)

## 2015-12-14 LAB — ETHANOL: Alcohol, Ethyl (B): <5 mg/dL (ref <5)
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
## 2015-12-15 DIAGNOSIS — F1310 Sedative, hypnotic or anxiolytic abuse, uncomplicated: Secondary | ICD-10-CM

## 2015-12-15 DIAGNOSIS — F1110 Opioid abuse, uncomplicated: Secondary | ICD-10-CM

## 2015-12-15 DIAGNOSIS — F1994 Other psychoactive substance use, unspecified with psychoactive substance-induced mood disorder: Secondary | ICD-10-CM

## 2015-12-15 DIAGNOSIS — F1410 Cocaine abuse, uncomplicated: Secondary | ICD-10-CM

## 2015-12-15 DIAGNOSIS — F19921 Other psychoactive substance use, unspecified with intoxication with delirium: Secondary | ICD-10-CM

## 2015-12-15 DIAGNOSIS — F4310 Post-traumatic stress disorder, unspecified: Secondary | ICD-10-CM

## 2015-12-15 DIAGNOSIS — T50905A Adverse effect of unspecified drugs, medicaments and biological substances, initial encounter: Secondary | ICD-10-CM

## 2015-12-15 LAB — URINALYSIS COMPLETE WITH MICROSCOPIC (ARMC ONLY)
BACTERIA UA: NONE SEEN
Bilirubin Urine: NEGATIVE
Glucose, UA: NEGATIVE mg/dL
HGB URINE DIPSTICK: NEGATIVE
Ketones, ur: NEGATIVE mg/dL
NITRITE: NEGATIVE
PH: 5 (ref 5.0–8.0)
Protein, ur: NEGATIVE mg/dL
SPECIFIC GRAVITY, URINE: 1.005 (ref 1.005–1.030)

## 2015-12-15 LAB — URINE DRUG SCREEN, QUALITATIVE (ARMC ONLY)
AMPHETAMINES, UR SCREEN: NOT DETECTED
Barbiturates, Ur Screen: POSITIVE — AB
Benzodiazepine, Ur Scrn: POSITIVE — AB
COCAINE METABOLITE, UR ~~LOC~~: POSITIVE — AB
Cannabinoid 50 Ng, Ur ~~LOC~~: NOT DETECTED
MDMA (ECSTASY) UR SCREEN: NOT DETECTED
METHADONE SCREEN, URINE: NOT DETECTED
Opiate, Ur Screen: POSITIVE — AB
Phencyclidine (PCP) Ur S: NOT DETECTED
TRICYCLIC, UR SCREEN: NOT DETECTED

## 2015-12-15 LAB — HCG, QUANTITATIVE, PREGNANCY: HCG, BETA CHAIN, QUANT, S: 2 m[IU]/mL (ref <5)

## 2015-12-15 LAB — POCT PREGNANCY, URINE: PREG TEST UR: POSITIVE — AB

## 2015-12-15 MED ORDER — IBUPROFEN 600 MG PO TABS
600.0000 mg | ORAL_TABLET | Freq: Four times a day (QID) | ORAL | Status: DC | PRN
  Administered 2015-12-15: 600 mg via ORAL
  Filled 2015-12-15: qty 1

## 2015-12-15 NOTE — ED Notes (Signed)
Breakfast tray given to pt 
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
## 2015-12-15 NOTE — ED Notes (Signed)
Per Dr. Manson Passey sugar tong applied to pt right ankle.

## 2015-12-15 NOTE — ED Notes (Signed)
Breakfast placed in room. 

## 2015-12-15 NOTE — [facility] Counselor (Signed)
Patient is sleeping and is unable to be aroused by the TTS to complete assessment.

## 2015-12-15 NOTE — ED Notes (Signed)
Pt ambulated to bathroom unassisted.

## 2015-12-15 NOTE — ED Provider Notes (Signed)
-----------------------------------------   6:10 AM on 12/15/2015 -----------------------------------------   Blood pressure 111/76, pulse 84, temperature 97.9 F (36.6 C), temperature source Axillary, resp. rate 16, height  (1.676 m), weight 159 lb (72.122 kg), SpO2 96 %.  The patient had no acute events since last update.  Calm and cooperative at this time.  Disposition is pending per Psychiatry/Behavioral Medicine team recommendations.     Irean Hong, MD 12/15/15 [phone]
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
## 2015-12-15 NOTE — ED Notes (Signed)
Lunch provided.

## 2015-12-15 NOTE — ED Notes (Signed)
BEHAVIORAL HEALTH ROUNDING Patient sleeping: No. Patient alert and oriented: yes Behavior appropriate: Yes.  ; If no, describe:  Nutrition and fluids offered: yes Toileting and hygiene offered: Yes  Sitter present: q15 minute observations and security  monitoring Law enforcement present: Yes  ODS  

## 2015-12-15 NOTE — ED Notes (Signed)
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
## 2015-12-15 NOTE — ED Notes (Signed)
1 bag retrieved from floor area of locker room for pt discharge

## 2015-12-15 NOTE — ED Notes (Signed)
ED BHU PLACEMENT JUSTIFICATION Is the patient under IVC or is there intent for IVC: Yes.   Is the patient medically cleared: Yes.   Is there vacancy in the ED BHU: Yes.   Is the population mix appropriate for patient: Pt with fx of right fibula  Is the patient awaiting placement in inpatient or outpatient setting: Yes.   Has the patient had a psychiatric consult: Yes.   Survey of unit performed for contraband, proper placement and condition of furniture, tampering with fixtures in bathroom, shower, and each patient room: Yes.  ; Findings:  APPEARANCE/BEHAVIOR Calm and cooperative NEURO ASSESSMENT Orientation: oriented x3  Denies pain Hallucinations: No.None reported (Hallucinations) Speech: Normal Gait: normal RESPIRATORY ASSESSMENT Even  Unlabored respirations  CARDIOVASCULAR ASSESSMENT Pulses equal   regular rate  Skin warm and dry   GASTROINTESTINAL ASSESSMENT no GI complaint EXTREMITIES Full ROM  PLAN OF CARE Provide calm/safe environment. Vital signs assessed twice daily. ED BHU Assessment once each 12-hour shift. Collaborate with intake RN daily or as condition indicates. Assure the ED provider has rounded once each shift. Provide and encourage hygiene. Provide redirection as needed. Assess for escalating behavior; address immediately and inform ED provider.  Assess family dynamic and appropriateness for visitation as needed: Yes.  ; If necessary, describe findings:  Educate the patient/family about BHU procedures/visitation: Yes.  ; If necessary, describe findings:

## 2015-12-15 NOTE — ED Notes (Signed)
Dr Clapacs with pt  

## 2015-12-15 NOTE — ED Notes (Signed)
Pt informed of her hcg result -
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
## 2015-12-15 NOTE — ED Notes (Signed)
Lunch tray placed in room. 

## 2015-12-15 NOTE — ED Notes (Signed)
Clapac consulting at this time   

## 2015-12-15 NOTE — ED Notes (Signed)
Urine collected and POC performed. MD informed positive POC test.

## 2015-12-15 NOTE — Discharge Instructions (Signed)
Fibular Ankle Fracture Treated With or Without Immobilization, Adult °A fibular fracture at your ankle is a break (fracture) bone in the smallest of the two bones in your lower leg, located on the outside of your leg (fibula) close to the area at your ankle joint. °CAUSES °· Rolling your ankle. °· Twisting your ankle. °· Extreme flexing or extending of your foot. °· Severe force on your ankle as when falling from a distance. °RISK FACTORS °· Jumping activities. °· Participation in sports. °· Osteoporosis. °· Advanced age. °· Previous ankle injuries. °SIGNS AND SYMPTOMS °· Pain. °· Swelling. °· Inability to put weight on injured ankle. °· Bruising. °· Bone deformities at site of injury. °DIAGNOSIS  °This fracture is diagnosed with the help of an X-ray exam. °TREATMENT  °If the fractured bone did not move out of place it usually will heal without problems and does casting or splinting. If immobilization is needed for comfort or the fractured bone moved out of place and will not heal properly with immobilization, a cast or splint will be used. °HOME CARE INSTRUCTIONS  °· Apply ice to the area of injury: °¨ Put ice in a plastic bag. °¨ Place a towel between your skin and the bag. °¨ Leave the ice on for 20 minutes, 2-3 times a day. °· Use crutches as directed. Resume walking without crutches as directed by your health care provider. °· Only take over-the-counter or prescription medicines for pain, discomfort, or fever as directed by your health care provider. °· If you have a removable splint or boot, do not remove the boot unless directed by your health care provider. °SEEK MEDICAL CARE IF:  °· You have continued pain or more swelling °· The medications do not control the pain. °SEEK IMMEDIATE MEDICAL CARE IF: °· You develop severe pain in the leg or foot. °· Your skin or nails below the injury turn blue or grey or feel cold or numb. °MAKE SURE YOU:  °· Understand these instructions. °· Will watch your  condition. °· Will get help right away if you are not doing well or get worse. °  °This information is not intended to replace advice given to you by your health care provider. Make sure you discuss any questions you have with your health care provider. °  °Document Released: 10/09/2005 Document Revised: 10/30/2014 Document Reviewed: 05/21/2013 °Elsevier Interactive Patient Education ©2016 Elsevier Inc. ° °

## 2016-02-24 ENCOUNTER — Emergency Department
Admission: EM | Admit: 2016-02-24 | Discharge: 2016-02-24 | Disposition: A | Discharge status: Home / Self Care | Attending: Emergency Medicine | Admitting: Emergency Medicine

## 2016-02-24 ENCOUNTER — Emergency Department

## 2016-02-24 DIAGNOSIS — S9001XA Contusion of right ankle, initial encounter: Secondary | ICD-10-CM | POA: Insufficient documentation

## 2016-02-24 DIAGNOSIS — F17200 Nicotine dependence, unspecified, uncomplicated: Secondary | ICD-10-CM | POA: Insufficient documentation

## 2016-02-24 DIAGNOSIS — F1290 Cannabis use, unspecified, uncomplicated: Secondary | ICD-10-CM | POA: Insufficient documentation

## 2016-02-24 DIAGNOSIS — Y999 Unspecified external cause status: Secondary | ICD-10-CM | POA: Insufficient documentation

## 2016-02-24 DIAGNOSIS — Z79899 Other long term (current) drug therapy: Secondary | ICD-10-CM | POA: Insufficient documentation

## 2016-02-24 DIAGNOSIS — F329 Major depressive disorder, single episode, unspecified: Secondary | ICD-10-CM | POA: Insufficient documentation

## 2016-02-24 DIAGNOSIS — Y939 Activity, unspecified: Secondary | ICD-10-CM | POA: Insufficient documentation

## 2016-02-24 DIAGNOSIS — Y929 Unspecified place or not applicable: Secondary | ICD-10-CM | POA: Insufficient documentation

## 2016-02-24 DIAGNOSIS — F1110 Opioid abuse, uncomplicated: Secondary | ICD-10-CM | POA: Insufficient documentation

## 2016-02-24 DIAGNOSIS — Z791 Long term (current) use of non-steroidal anti-inflammatories (NSAID): Secondary | ICD-10-CM | POA: Insufficient documentation

## 2016-02-24 DIAGNOSIS — F1410 Cocaine abuse, uncomplicated: Secondary | ICD-10-CM | POA: Insufficient documentation

## 2016-02-24 MED ORDER — KETOROLAC TROMETHAMINE 60 MG/2ML IM SOLN
60.0000 mg | Freq: Once | INTRAMUSCULAR | Status: AC
  Administered 2016-02-24: 60 mg via INTRAMUSCULAR
  Filled 2016-02-24: qty 2

## 2016-02-24 MED ORDER — KETOROLAC TROMETHAMINE 10 MG PO TABS
10.0000 mg | ORAL_TABLET | Freq: Once | ORAL | Status: DC
Start: 2016-02-24 — End: 2016-02-24

## 2016-02-24 MED ORDER — KETOROLAC TROMETHAMINE 10 MG PO TABS: 10.0000 mg | ORAL_TABLET | Freq: Three times a day (TID) | ORAL | Status: AC | PRN
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
## 2016-02-24 NOTE — Discharge Instructions (Signed)

## 2016-02-24 NOTE — ED Notes (Signed)
Pt discharged to home.  Discharge instructions reviewed.  Verbalized understanding.  No questions or concerns at this time.  Teach back verified.  Pt in NAD.  No items left in ED.   
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
## 2016-02-24 NOTE — ED Notes (Signed)
Pregnancy test negative

## 2016-05-01 ENCOUNTER — Ambulatory Visit
Admission: EM | Admit: 2016-05-01 | Discharge: 2016-05-01 | Disposition: A | Discharge status: Home / Self Care | Attending: Family Medicine | Admitting: Family Medicine

## 2016-05-01 DIAGNOSIS — R1013 Epigastric pain: Secondary | ICD-10-CM | POA: Diagnosis not present

## 2016-05-01 DIAGNOSIS — R748 Abnormal levels of other serum enzymes: Secondary | ICD-10-CM | POA: Diagnosis not present

## 2016-05-01 DIAGNOSIS — R112 Nausea with vomiting, unspecified: Secondary | ICD-10-CM

## 2016-05-01 LAB — CBC WITH DIFFERENTIAL/PLATELET
BASOS ABS: 0.1 10*3/uL (ref 0–0.1)
Basophils Relative: 1 %
EOS PCT: 1 %
Eosinophils Absolute: 0.1 10*3/uL (ref 0–0.7)
HCT: 42.1 % (ref 35.0–47.0)
Hemoglobin: 14.2 g/dL (ref 12.0–16.0)
LYMPHS PCT: 24 %
Lymphs Abs: 1.6 10*3/uL (ref 1.0–3.6)
MCH: 30.4 pg (ref 26.0–34.0)
MCHC: 33.7 g/dL (ref 32.0–36.0)
MCV: 90.2 fL (ref 80.0–100.0)
MONO ABS: 0.4 10*3/uL (ref 0.2–0.9)
Monocytes Relative: 6 %
NEUTROS ABS: 4.8 10*3/uL (ref 1.4–6.5)
NEUTROS PCT: 68 %
PLATELETS: 187 10*3/uL (ref 150–440)
RBC: 4.67 MIL/uL (ref 3.80–5.20)
RDW: 13.7 % (ref 11.5–14.5)
WBC: 6.9 10*3/uL (ref 3.6–11.0)

## 2016-05-01 LAB — COMPREHENSIVE METABOLIC PANEL
ALBUMIN: 4.3 g/dL (ref 3.5–5.0)
ALT: 203 U/L — ABNORMAL HIGH (ref 14–54)
AST: 250 U/L — AB (ref 15–41)
Alkaline Phosphatase: 194 U/L — ABNORMAL HIGH (ref 38–126)
Anion gap: 5 (ref 5–15)
BUN: 8 mg/dL (ref 6–20)
CHLORIDE: 105 mmol/L (ref 101–111)
CO2: 28 mmol/L (ref 22–32)
Calcium: 8.8 mg/dL — ABNORMAL LOW (ref 8.9–10.3)
Creatinine, Ser: 0.61 mg/dL (ref 0.44–1.00)
GFR calc Af Amer: >60 mL/min (ref >60)
GFR calc non Af Amer: >60 mL/min (ref >60)
GLUCOSE: 115 mg/dL — AB (ref 65–99)
POTASSIUM: 3.7 mmol/L (ref 3.5–5.1)
SODIUM: 138 mmol/L (ref 135–145)
Total Bilirubin: 1.4 mg/dL — ABNORMAL HIGH (ref 0.3–1.2)
Total Protein: 6.7 g/dL (ref 6.5–8.1)

## 2016-05-01 LAB — LIPASE, BLOOD: LIPASE: 22 U/L (ref 11–51)

## 2016-05-01 MED ORDER — ONDANSETRON 8 MG PO TBDP
8.0000 mg | ORAL_TABLET | Freq: Once | ORAL | Status: AC
  Administered 2016-05-01: 8 mg via ORAL

## 2016-05-01 MED ORDER — ONDANSETRON 8 MG PO TBDP: 8.0000 mg | ORAL_TABLET | Freq: Three times a day (TID) | ORAL | Status: DC | PRN

## 2016-05-01 NOTE — ED Notes (Addendum)
Patient presents with the complaint of vomiting and right sided abdominal pain. It started last night with the vomiting, and the right side pain today. The pain in her abdomen is a sharp shooting pain.

## 2016-05-01 NOTE — ED Provider Notes (Signed)
CSN: [id number]     Arrival date & time 05/01/16  1052 History   First MD Initiated Contact with Patient 05/01/16 1336     Chief Complaint  Patient presents with  . Emesis    Last Night  . Abdominal Pain    Right Side   (Consider location/radiation/quality/duration/timing/severity/associated sxs/prior Treatment) Patient is a 45 y.o. female presenting with abdominal pain. The history is provided by the patient.  Abdominal Pain Pain location:  Epigastric Pain quality: aching   Pain radiates to:  Does not radiate Pain severity:  Severe Onset quality:  Sudden Duration:  6 hours Timing:  Sporadic Progression:  Resolved Relieved by:  Nothing Associated symptoms: nausea and vomiting   Associated symptoms: no anorexia, no belching, no chest pain, no chills, no constipation, no cough, no diarrhea, no dysuria, no fatigue, no fever, no flatus, no hematemesis, no hematochezia, no hematuria, no melena, no shortness of breath, no sore throat, no vaginal bleeding and no vaginal discharge   Risk factors: no alcohol abuse, no aspirin use, not elderly, has not had multiple surgeries, no NSAID use, not obese, not pregnant and no recent hospitalization     Past Medical History  Diagnosis Date  . Depression   . PTSD (post-traumatic stress disorder)   . Bipolar 1 disorder (HCC)   . Anxiety    Past Surgical History  Procedure Laterality Date  . Knee surgery     History reviewed. No pertinent family history. Social History  Substance Use Topics  . Smoking status: Current Every Day Smoker  . Smokeless tobacco: None  . Alcohol Use: No   OB History    Gravida Para Term Preterm AB TAB SAB Ectopic Multiple Living   Review of Systems  Constitutional: Negative for fever, chills and fatigue.  HENT: Negative for sore throat.   Respiratory: Negative for cough and shortness of breath.   Cardiovascular: Negative for chest pain.  Gastrointestinal: Positive for nausea, vomiting and  abdominal pain. Negative for diarrhea, constipation, melena, hematochezia, anorexia, flatus and hematemesis.  Genitourinary: Negative for dysuria, hematuria, vaginal bleeding and vaginal discharge.    Allergies  Review of patient's allergies indicates no known allergies.  Home Medications   Prior to Admission medications   Medication Sig Start Date End Date Taking? Authorizing Provider  busPIRone (BUSPAR) 7.5 MG tablet Take 7.5 mg by mouth 2 (two) times daily.   Yes Historical Provider, MD  carbamazepine (TEGRETOL) 200 MG tablet Take 200 mg by mouth 2 (two) times daily.   Yes Historical Provider, MD  doxepin (SINEQUAN) 10 MG capsule Take 10 mg by mouth.   Yes Historical Provider, MD  butalbital-acetaminophen-caffeine (FIORICET, ESGIC) 50-325-40 MG per tablet Take 1 tablet by mouth every 4 (four) hours as needed for headache.    Historical Provider, MD  clonazePAM (KLONOPIN) 1 MG tablet Take 1 mg by mouth 2 (two) times daily.    Historical Provider, MD  doxycycline (VIBRAMYCIN) 100 MG capsule Take 1 capsule (100 mg total) by mouth 2 (two) times daily. 06/29/15   Hassan Rowan, MD  HYDROcodone-acetaminophen (NORCO) 5-325 MG per tablet Take 1 tablet by mouth every 8 (eight) hours as needed for moderate pain. 06/29/15   Hassan Rowan, MD  ibuprofen (ADVIL,MOTRIN) 800 MG tablet Take 1 tablet (800 mg total) by mouth every 8 (eight) hours as needed. 04/02/15   Emily Filbert, MD  metroNIDAZOLE (FLAGYL) 500 MG tablet Take 1  tablet (500 mg total) by mouth 2 (two) times daily. 06/29/15   Hassan RowanEugene Wade, MD  ondansetron (ZOFRAN ODT) 8 MG disintegrating tablet Take 1 tablet (8 mg total) by mouth every 8 (eight) hours as needed for nausea or vomiting. 05/01/16   Payton Mccallumrlando Shiv Shuey, MD  PARoxetine (PAXIL) 20 MG tablet Take 20 mg by mouth daily.    Historical Provider, MD  terconazole (TERAZOL 3) 80 MG vaginal suppository Place 1 suppository (80 mg total) vaginally at bedtime. 06/29/15   Hassan RowanEugene Wade, MD   Meds Ordered and  Administered this Visit   Medications  ondansetron (ZOFRAN-ODT) disintegrating tablet 8 mg (8 mg Oral Given 05/01/16 1349)    BP 132/85 mmHg  Pulse 72  Temp(Src) 97.6 F (36.4 C) (Oral)  Resp 18  Ht 5\' 6"  (1.676 m)  Wt 164 lb (74.39 kg)  BMI 26.48 kg/m2  SpO2 97%  LMP 04/17/2016 No data found.   Physical Exam  Constitutional: She appears well-developed.  Cardiovascular: Normal rate, regular rhythm, normal heart sounds and intact distal pulses.   No murmur heard. Pulmonary/Chest: Effort normal and breath sounds normal. No respiratory distress. She has no wheezes. She has no rales.  Abdominal: Soft. Bowel sounds are normal. She exhibits no distension and no mass. There is tenderness (mild epigastric ). There is no rebound and no guarding.  Neurological: She is alert.  Skin: Skin is warm and dry. No rash noted. She is not diaphoretic. No erythema.  Nursing note and vitals reviewed.   ED Course  Procedures (including critical care time)  Labs Review Labs Reviewed  COMPREHENSIVE METABOLIC PANEL - Abnormal; Notable for the following:    Glucose, Bld 115 (*)    Calcium 8.8 (*)    AST 250 (*)    ALT 203 (*)    Alkaline Phosphatase 194 (*)    Total Bilirubin 1.4 (*)    All other components within normal limits  CBC WITH DIFFERENTIAL/PLATELET  LIPASE, BLOOD    Imaging Review No results found.   Visual Acuity Review  Right Eye Distance:   Left Eye Distance:   Bilateral Distance:    Right Eye Near:   Left Eye Near:    Bilateral Near:         MDM   1. Epigastric pain   2. Nausea and vomiting, vomiting of unspecified type   3. Elevated liver enzymes     Discharge Medication List as of 05/01/2016  2:58 PM    START taking these medications   Details  ondansetron (ZOFRAN ODT) 8 MG disintegrating tablet Take 1 tablet (8 mg total) by mouth every 8 (eight) hours as needed for nausea or vomiting., Starting 05/01/2016, Until Discontinued, Normal       1. Lab  results and diagnosis reviewed with patient 2. rx as per orders above; reviewed possible side effects, interactions, risks and benefits  3. Recommend supportive treatment with clear liquids/increased fluids then advance slowly as tolerated; hold carbamazepine today and tomorrow 4. Follow-up with PCP this week for re-evaluation, recheck labs and possible referral for abdominal US  5. F/u prn if symptoms worsen or don't improve    Payton Mccallumrlando Zakira Ressel, MD 05/01/16 [phone]

## 2016-05-01 NOTE — Discharge Instructions (Signed)
Alanine Aminotransferase Test WHY AM I HAVING THIS TEST? Alanine aminotransferase (ALT) is an enzyme that is found mainly in the liver but also in the kidney, heart, and skeletal muscle. Under normal conditions, ALT levels in the blood are low. Elevated levels may be caused by:  Damage to the liver. This is the main cause of an abnormality.  Damage to the kidney, heart, and skeletal muscle.  Certain medicines. ALT is tested in long-standing liver disease. It may be used to monitor the treatment of people who have liver disease or to see if the treatment is working. ALT may be ordered either by itself or along with other tests. WHAT KIND OF SAMPLE IS TAKEN? A blood sample is required for this test. It is usually collected by inserting a needle into a vein. HOW DO I PREPARE FOR THE TEST? There is no preparation or fasting for this test. WHAT ARE THE REFERENCE RANGES? Reference ranges are considered healthy ranges established after testing a large group of healthy people. Reference ranges may vary among different people, labs, and hospitals. It is your responsibility to obtain your test results. Ask the lab or department performing the test when and how you will get your results. The reference ranges for the ALT test are as follows:  Adult or child: 4-36 International Units per liter.  Elderly adult: may be slightly higher than adult values.  Infant: may be twice as high as adult values.  Values may be higher in men and African Americans. WHAT DO THE RESULTS MEAN? Test results that are significantly elevated may indicate:  Hepatitis.  Liver tissue death (hepatic necrosis).  Decreased blood flow to the liver (hepatic ischemia). Test results that are moderately elevated may indicate:  Cirrhosis.  Liver tumor.  Medicines that are causing strain on the liver.  Severe burns. Test results that are mildly elevated may indicate:  Pancreatitis.  Heart attack.  Infectious  mononucleosis.  Shock. Talk with your health care provider to discuss your results, treatment options, and if necessary, the need for more tests. Talk with your health care provider if you have any questions about your results.   This information is not intended to replace advice given to you by your health care provider. Make sure you discuss any questions you have with your health care provider.   Document Released: 10/31/2004 Document Revised: 10/30/2014 Document Reviewed: 03/12/2014 Elsevier Interactive Patient Education 2016 Elsevier Inc.    Call PCP and schedule follow up appointment for this week for reevaluation and recheck labs Hold carbamazapine for next 2 days

## 2017-03-02 ENCOUNTER — Ambulatory Visit
Admission: EM | Admit: 2017-03-02 | Discharge: 2017-03-02 | Disposition: A | Discharge status: Home / Self Care | Attending: Family Medicine | Admitting: Family Medicine

## 2017-03-02 ENCOUNTER — Encounter: Payer: Self-pay | Admitting: *Deleted

## 2017-03-02 DIAGNOSIS — J9801 Acute bronchospasm: Secondary | ICD-10-CM | POA: Diagnosis not present

## 2017-03-02 DIAGNOSIS — J0100 Acute maxillary sinusitis, unspecified: Secondary | ICD-10-CM

## 2017-03-02 DIAGNOSIS — R05 Cough: Secondary | ICD-10-CM

## 2017-03-02 LAB — RAPID STREP SCREEN (MED CTR MEBANE ONLY): Streptococcus, Group A Screen (Direct): NEGATIVE

## 2017-03-02 MED ORDER — DOXYCYCLINE HYCLATE 100 MG PO CAPS: 100.0000 mg | ORAL_CAPSULE | Freq: Two times a day (BID) | ORAL | 0 refills | Status: DC

## 2017-03-02 MED ORDER — PREDNISONE 20 MG PO TABS: 40.0000 mg | ORAL_TABLET | Freq: Every day | ORAL | 0 refills | Status: DC

## 2017-03-02 MED ORDER — ALBUTEROL SULFATE HFA 108 (90 BASE) MCG/ACT IN AERS: 2.0000 | INHALATION_SPRAY | RESPIRATORY_TRACT | 0 refills | Status: DC | PRN

## 2017-03-02 MED ORDER — HYDROCOD POLST-CPM POLST ER 10-8 MG/5ML PO SUER: 5.0000 mL | Freq: Every evening | ORAL | 0 refills | Status: DC | PRN

## 2017-03-02 NOTE — ED Triage Notes (Signed)
Productive cough- yellow, runny nose- yellow, head congestion, headache, body aches, x2 days.

## 2017-03-02 NOTE — ED Provider Notes (Signed)
[facility] ____________________________________________  Time seen: Approximately 9:26 AM  I have reviewed the triage vital signs and the nursing notes.   HISTORY  Chief Complaint Cough and Nasal Congestion   HPI Molly Proctor is a 46 y.o. female  presenting for evaluation of runny nose, nasal congestion, cough and sinus pressure that has been worsened over the last 2 days. Patient states overall for the last 2 months that she has been having a lot of nasal congestion and sinus pressure and intermittent cough. States that symptoms have never fully resolved. States over-the-counter medications to him to help symptoms improve and be more tolerable but then symptoms worsened again. Reports some accompanying sore throat. Denies persistent sore throat. States mild sore throat at this time. Reports continues to eat and drink well. Denies any fevers. Denies current on sick contacts. Reports does have some seasonal allergy history. Denies pain at this time.  Denies chest pain, shortness of breath, abdominal pain, or rash. Denies recent sickness. Denies recent antibiotic use.   Patient's last menstrual period was 02/16/2017 (approximate).Denies pregnancy   Past Medical History:  Diagnosis Date  . Anxiety   . Bipolar 1 disorder (HCC)   . Depression   . PTSD (post-traumatic stress disorder)     Patient Active Problem List   Diagnosis Date Noted  . Substance induced mood disorder (HCC) 12/15/2015  . Delirium, drug-induced (HCC) 12/15/2015  . 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 PTSD (post-traumatic stress disorder) 12/15/2015  . Cocaine abuse 12/15/2015  . Opiate abuse, episodic 12/15/2015    Past Surgical History:  Procedure Laterality Date  . ABDOMINAL SURGERY    . CHOLECYSTECTOMY    . KNEE SURGERY       No current facility-administered medications for this encounter.   Current Outpatient Prescriptions:  .  butalbital-acetaminophen-caffeine (FIORICET, ESGIC) 50-325-40 MG  per tablet, Take 1 tablet by mouth every 4 (four) hours as needed for headache., Disp: , Rfl:  .  divalproex (DEPAKOTE) 500 MG DR tablet, Take 500 mg by mouth 3 (three) times daily., Disp: , Rfl:  .  gabapentin (NEURONTIN) 400 MG capsule, Take 400 mg by mouth 3 (three) times daily., Disp: , Rfl:  .  albuterol (PROVENTIL HFA;VENTOLIN HFA) 108 (90 Base) MCG/ACT inhaler, Inhale 2 puffs into the lungs every 4 (four) hours as needed., Disp: 1 Inhaler, Rfl: 0 .  busPIRone (BUSPAR) 7.5 MG tablet, Take 7.5 mg by mouth 2 (two) times daily., Disp: , Rfl:  .  carbamazepine (TEGRETOL) 200 MG tablet, Take 200 mg by mouth 2 (two) times daily., Disp: , Rfl:  .  chlorpheniramine-HYDROcodone (TUSSIONEX PENNKINETIC ER) 10-8 MG/5ML SUER, Take 5 mLs by mouth at bedtime as needed. do not drive or operate machinery while taking as can cause drowsiness., Disp: 75 mL, Rfl: 0 .  clonazePAM (KLONOPIN) 1 MG tablet, Take 1 mg by mouth 2 (two) times daily., Disp: , Rfl:  .  doxepin (SINEQUAN) 10 MG capsule, Take 10 mg by mouth., Disp: , Rfl:  .  doxycycline (VIBRAMYCIN) 100 MG capsule, Take 1 capsule (100 mg total) by mouth 2 (two) times daily., Disp: 20 capsule, Rfl: 0 .  HYDROcodone-acetaminophen (NORCO) 5-325 MG per tablet, Take 1 tablet by mouth every 8 (eight) hours as needed for moderate pain., Disp: 20 tablet, Rfl: 0 .  ibuprofen (ADVIL,MOTRIN) 800 MG tablet, Take 1 tablet (800 mg total) by mouth every 8 (eight) hours as needed., Disp: 30 tablet, Rfl: 0 .  metroNIDAZOLE (FLAGYL) 500 MG tablet, Take  1 tablet (500 mg total) by mouth 2 (two) times daily., Disp: 20 tablet, Rfl: 0 .  ondansetron (ZOFRAN ODT) 8 MG disintegrating tablet, Take 1 tablet (8 mg total) by mouth every 8 (eight) hours as needed for nausea or vomiting., Disp: 6 tablet, Rfl: 0 .  PARoxetine (PAXIL) 20 MG tablet, Take 20 mg by mouth daily., Disp: , Rfl:  .  predniSONE (DELTASONE) 20 MG tablet, Take 2 tablets (40 mg total) by mouth daily., Disp: 10 tablet,  Rfl: 0 .  terconazole (TERAZOL 3) 80 MG vaginal suppository, Place 1 suppository (80 mg total) vaginally at bedtime., Disp: 3 suppository, Rfl: 0  Allergies Patient has no known allergies.  History reviewed. No pertinent family history.  Social History Social History  Substance Use Topics  . Smoking status: Current Every Day Smoker  . Smokeless tobacco: Never Used  . Alcohol use No    Review of Systems Constitutional: No fever/chills ENT: Positive sore throat. Cardiovascular: Denies chest pain. Respiratory: Denies shortness of breath. Gastrointestinal: No abdominal pain.   Genitourinary: Negative for dysuria. Musculoskeletal: Negative for back pain. Skin: Negative for rash.   ____________________________________________   PHYSICAL EXAM:  VITAL SIGNS: ED Triage Vitals  Enc Vitals Group     BP 03/02/17 0846 130/82     Pulse Rate 03/02/17 0846 72     Resp 03/02/17 0846 16     Temp 03/02/17 0846 97.9 F (36.6 C)     Temp Source 03/02/17 0846 Oral     SpO2 03/02/17 0846 96 %     Weight 03/02/17 0850 170 lb (77.1 kg)     Height 03/02/17 0850 5\' 6"  (1.676 m)     Head Circumference --      Peak Flow --      Pain Score --      Pain Loc --      Pain Edu? --      Excl. in GC? --     Constitutional: Alert and oriented. Well appearing and in no acute distress. Eyes: Conjunctivae are normal. PERRL. EOMI. Head: Atraumatic.Mild tenderness to palpation bilateral maxillary sinuses, nontender frontal sinuses. No swelling. No erythema.   Ears: no erythema, normal TMs bilaterally.   Nose: nasal congestion with bilateral nasal turbinate erythema and edema.   Mouth/Throat: Mucous membranes are moist.  Mild pharyngeal erythema.No tonsillar swelling or exudate.  Neck: No stridor.  No cervical spine tenderness to palpation. Hematological/Lymphatic/Immunilogical: No cervical lymphadenopathy. Cardiovascular: Normal rate, regular rhythm. Grossly normal heart sounds.  Good peripheral  circulation. Respiratory: Normal respiratory effort.  No retractions. No rhonchi. Mild scattered inspiratory and expiratory wheezes. Good air movement. Dry intermittent cough noted in room with bronchospasm. Speaks in complete sentences. Gastrointestinal: Soft and nontender. Musculoskeletal: No cervical, thoracic or lumbar tenderness to palpation.  Neurologic:  Normal speech and language. No gait instability. Skin:  Skin is warm, dry and intact. No rash noted. Psychiatric: Mood and affect are normal. Speech and behavior are normal.  ___________________________________________   LABS (all labs ordered are listed, but only abnormal results are displayed)  Labs Reviewed  RAPID STREP SCREEN (NOT AT [facility])  CULTURE, GROUP A STREP [facility])   ____________________________________________  RADIOLOGY  No results found. ____________________________________________   PROCEDURES Procedures  _________________________________________   INITIAL IMPRESSION / ASSESSMENT AND PLAN / ED COURSE  Pertinent labs & imaging results that were available during my care of the patient were reviewed by me and considered in my medical decision making (see chart for details).  Well  appearing patient. No acute distress. Suspect sinusitis and bronchitis. Discussed the patient suspects some seasonal allergy component. Discussed with patient and will defer x-ray at this time, patient states that she will follow-up if symptoms continue and chest x-ray completed at that time. Will treat patient with oral prednisone,. Albuterol inhaler, when necessary Tussionex at night and oral doxycycline. Encouraged rest, fluids and supportive care.Discussed indication, risks and benefits of medications with patient.  Discussed follow up with Primary care physician this week. Discussed follow up and return parameters including no resolution or any worsening concerns. Patient verbalized understanding and agreed to plan.    ____________________________________________   FINAL CLINICAL IMPRESSION(S) / ED DIAGNOSES  Final diagnoses:  Acute maxillary sinusitis, recurrence not specified  Cough due to bronchospasm     Discharge Medication List as of 03/02/2017  9:34 AM    START taking these medications   Details  albuterol (PROVENTIL HFA;VENTOLIN HFA) 108 (90 Base) MCG/ACT inhaler Inhale 2 puffs into the lungs every 4 (four) hours as needed., Starting Fri 03/02/2017, Normal    chlorpheniramine-HYDROcodone (TUSSIONEX PENNKINETIC ER) 10-8 MG/5ML SUER Take 5 mLs by mouth at bedtime as needed. do not drive or operate machinery while taking as can cause drowsiness., Starting Fri 03/02/2017, Print    predniSONE (DELTASONE) 20 MG tablet Take 2 tablets (40 mg total) by mouth daily., Starting Fri 03/02/2017, Normal        Note: This dictation was prepared with Dragon dictation along with smaller phrase technology. Any transcriptional errors that result from this process are unintentional.         Renford DillsMiller, Keri Veale, NP 03/02/17 1244

## 2017-03-02 NOTE — Discharge Instructions (Signed)
Take medication as prescribed. Rest. Drink plenty of fluids.  ° °Follow up with your primary care physician this week as needed. Return to Urgent care for new or worsening concerns.  ° °

## 2017-03-05 LAB — CULTURE, GROUP A STREP (THRC)

## 2017-04-23 ENCOUNTER — Other Ambulatory Visit
Admission: RE | Admit: 2017-04-23 | Discharge: 2017-04-23 | Disposition: A | Discharge status: Home / Self Care | Source: Ambulatory Visit | Attending: Orthopedic Surgery | Admitting: Orthopedic Surgery

## 2017-04-23 DIAGNOSIS — M2550 Pain in unspecified joint: Secondary | ICD-10-CM | POA: Diagnosis not present

## 2017-04-23 LAB — URIC ACID: Uric Acid, Serum: 5.7 mg/dL (ref 2.3–6.6)

## 2017-04-23 LAB — C-REACTIVE PROTEIN: CRP: 1.1 mg/dL — ABNORMAL HIGH (ref <1.0)

## 2017-04-24 LAB — RHEUMATOID FACTOR

## 2017-04-24 LAB — ANTISTREPTOLYSIN O TITER: ASO: 297 [IU]/mL — AB (ref 0.0–200.0)

## 2017-04-24 LAB — ANA W/REFLEX IF POSITIVE: Anti Nuclear Antibody(ANA): NEGATIVE

## 2017-04-24 LAB — CYCLIC CITRUL PEPTIDE ANTIBODY, IGG/IGA: CCP ANTIBODIES IGG/IGA: 14 U (ref 0–19)

## 2017-06-29 ENCOUNTER — Other Ambulatory Visit: Payer: Self-pay | Admitting: Orthopedic Surgery

## 2017-06-29 DIAGNOSIS — M23222 Derangement of posterior horn of medial meniscus due to old tear or injury, left knee: Secondary | ICD-10-CM

## 2017-06-29 DIAGNOSIS — M170 Bilateral primary osteoarthritis of knee: Secondary | ICD-10-CM

## 2017-07-04 ENCOUNTER — Ambulatory Visit

## 2017-07-10 ENCOUNTER — Ambulatory Visit
Admission: RE | Admit: 2017-07-10 | Discharge: 2017-07-10 | Disposition: A | Discharge status: Home / Self Care | Source: Ambulatory Visit | Attending: Orthopedic Surgery | Admitting: Orthopedic Surgery

## 2017-07-10 DIAGNOSIS — M23222 Derangement of posterior horn of medial meniscus due to old tear or injury, left knee: Secondary | ICD-10-CM

## 2017-07-10 DIAGNOSIS — M23252 Derangement of posterior horn of lateral meniscus due to old tear or injury, left knee: Secondary | ICD-10-CM | POA: Diagnosis present

## 2017-07-10 DIAGNOSIS — M170 Bilateral primary osteoarthritis of knee: Secondary | ICD-10-CM | POA: Insufficient documentation

## 2017-07-12 IMAGING — CR DG ANKLE COMPLETE 3+V*R*
1 series · 3 of 3 positions shown · non-contrast
Comparison: None.

CLINICAL DATA: Two week history of right ankle pain.

EXAM:
RIGHT ANKLE - COMPLETE 3+ VIEW

[Series 1: ap · 0.17mm/px · 3 of 3 slices shown]
[im 1/3]
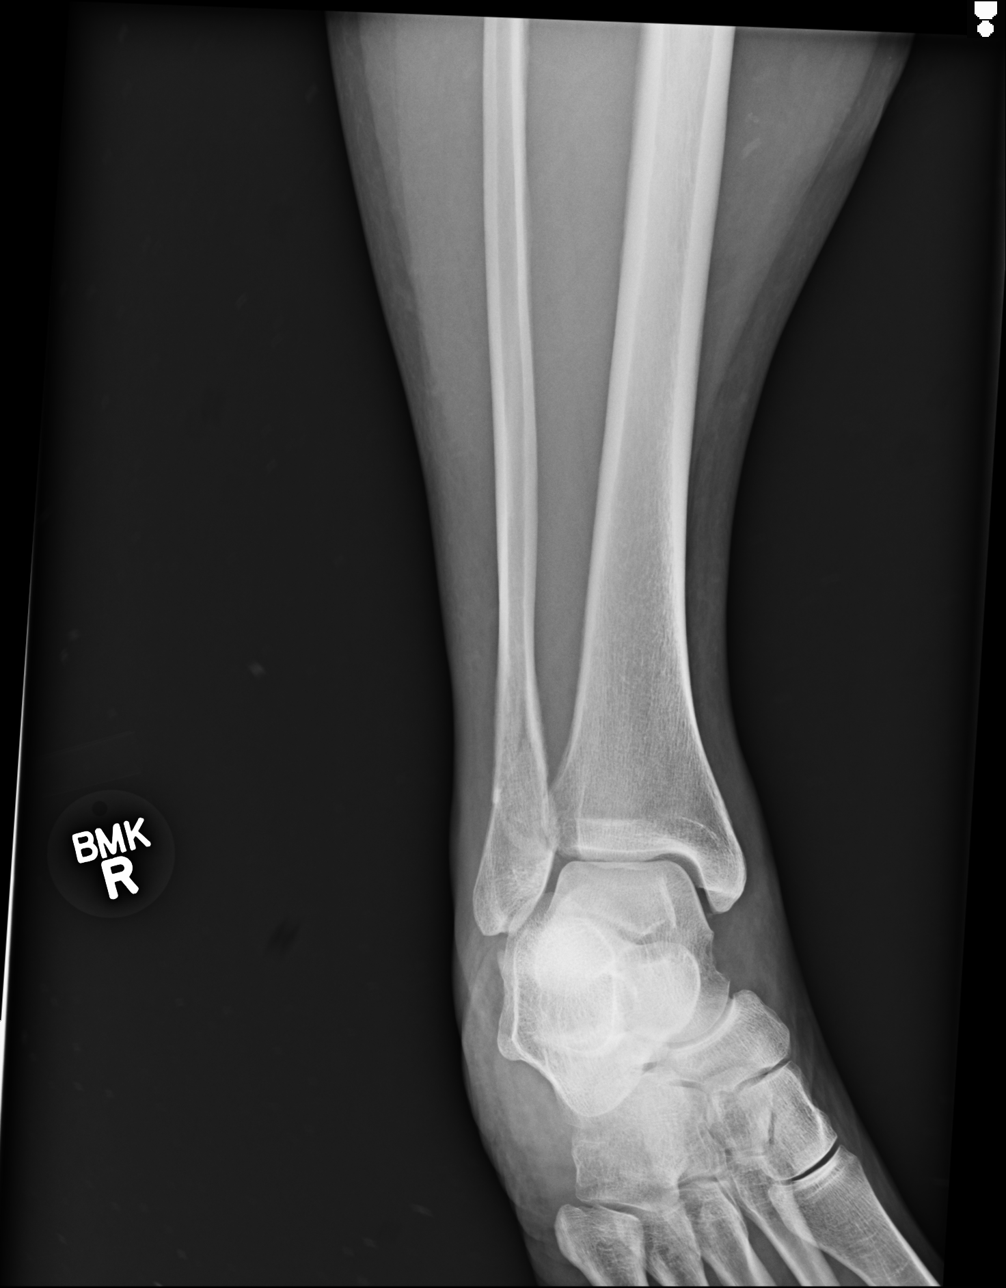
[im 2/3]
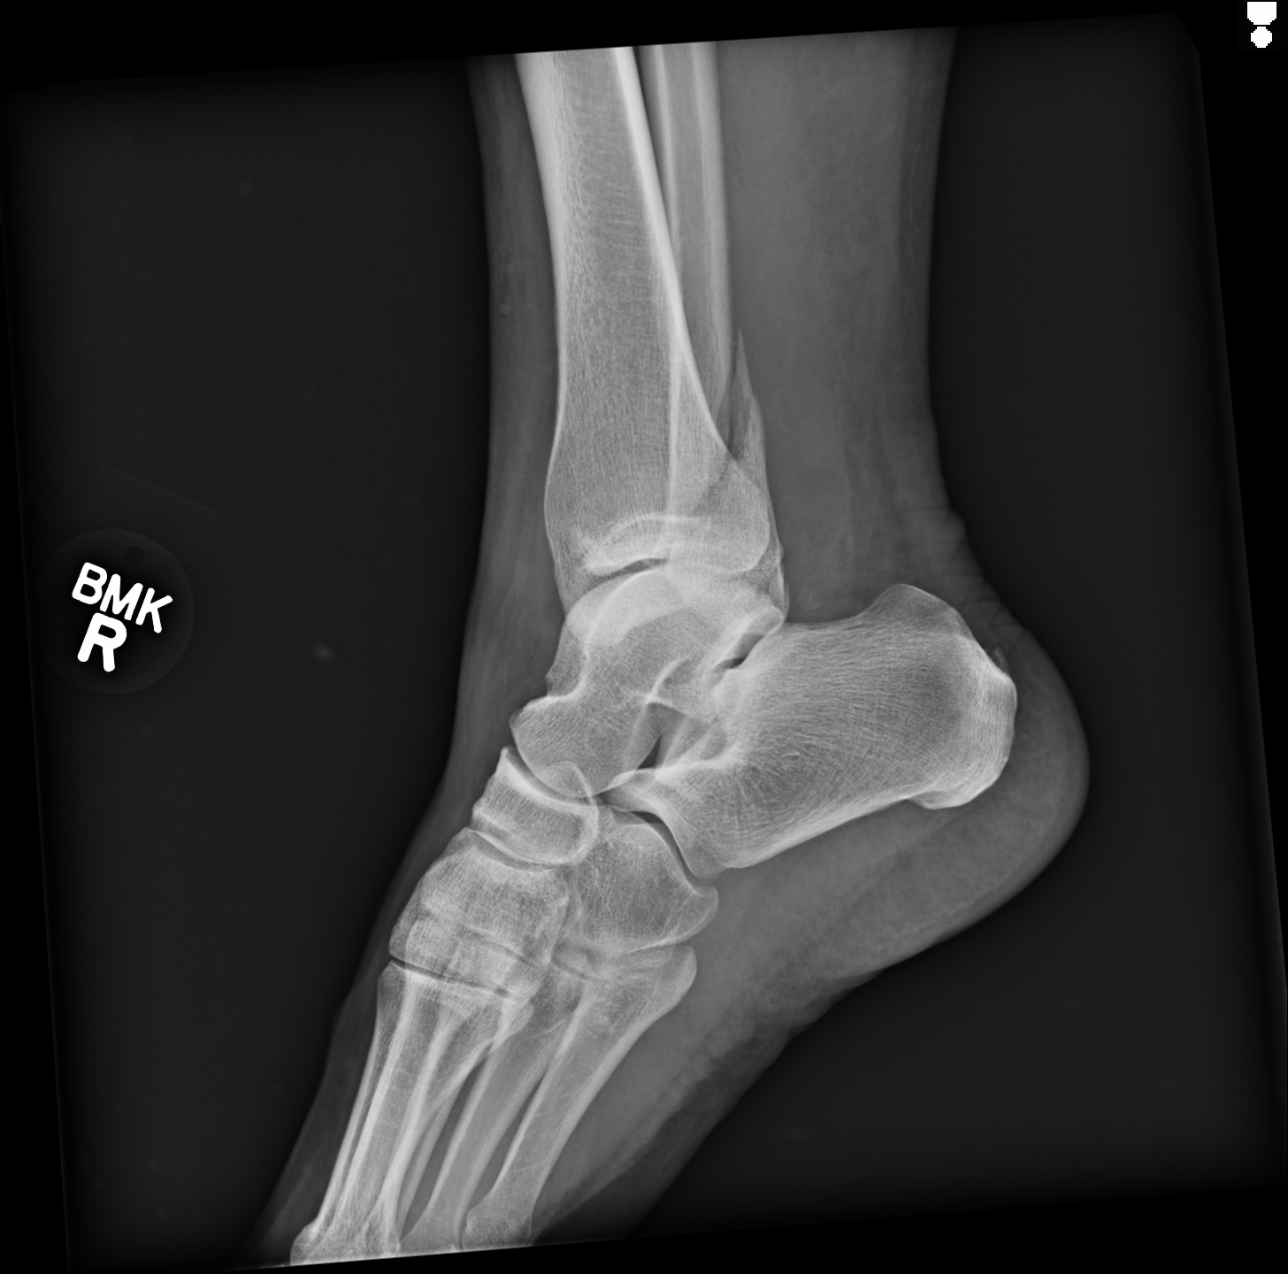
[im 3/3]
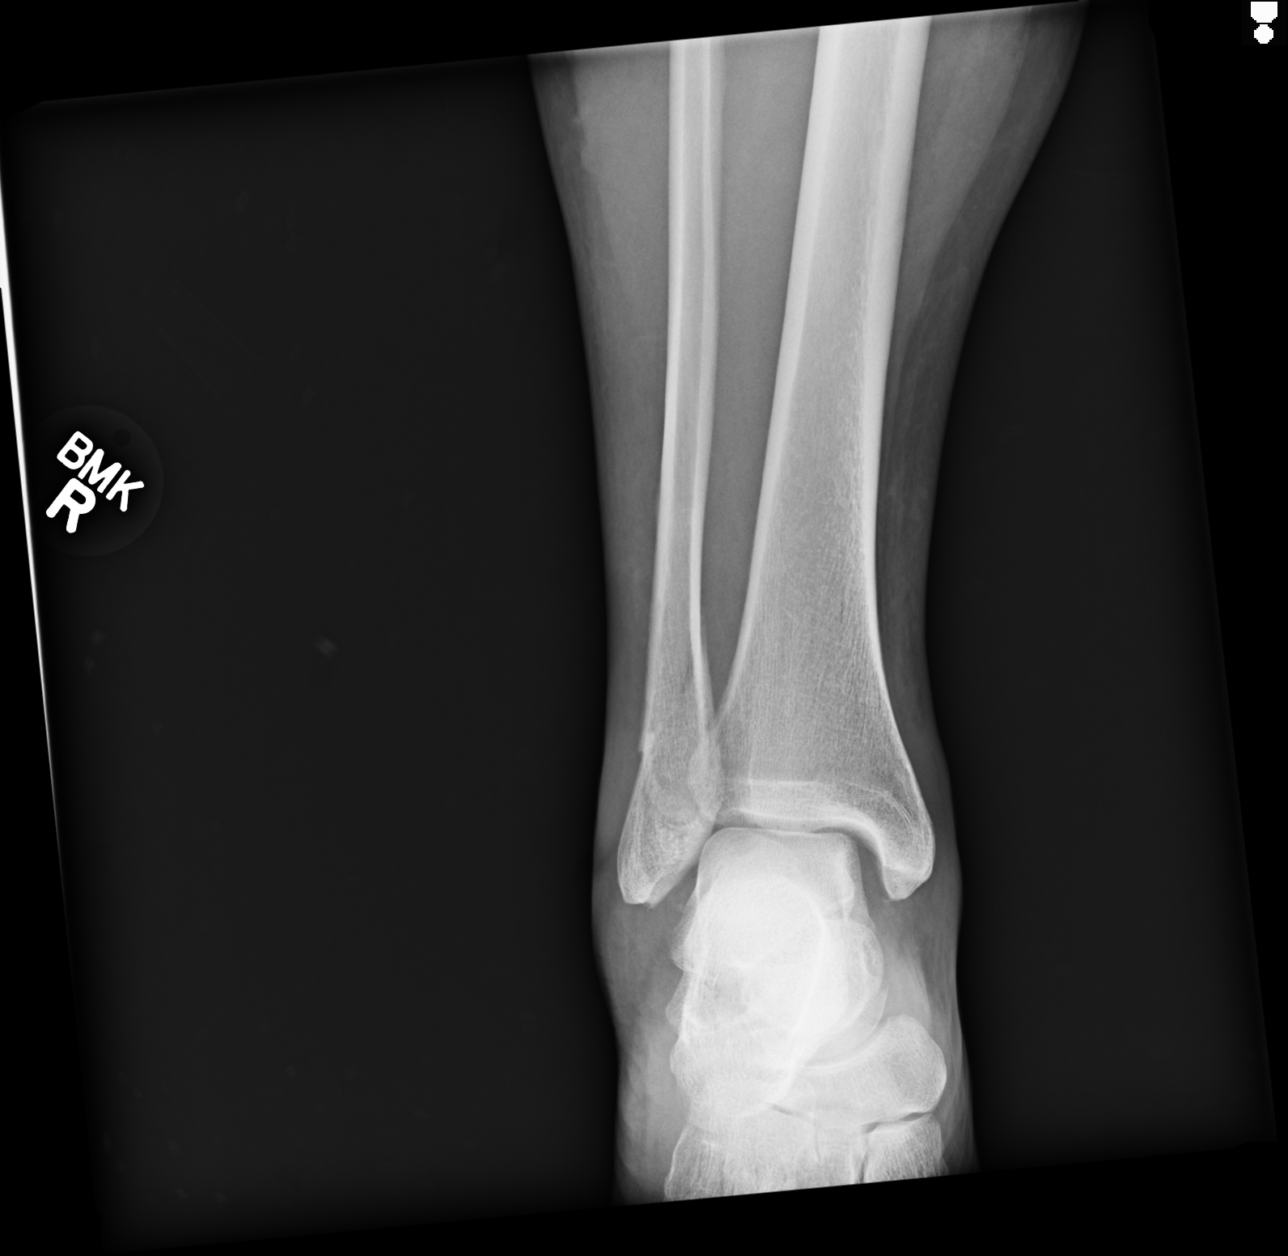

[3 of 3 positions shown; findings below may reference images not displayed]

FINDINGS: Three-view exam shows an oblique fracture of the distal fibula at
the level of the ankle mortise. No evidence for subluxation. No
associated fracture of the distal tibia.
IMPRESSION: Oblique distal fibular fracture.

## 2017-09-14 ENCOUNTER — Encounter: Payer: Self-pay | Admitting: *Deleted

## 2017-09-14 ENCOUNTER — Ambulatory Visit
Admission: EM | Admit: 2017-09-14 | Discharge: 2017-09-14 | Disposition: A | Discharge status: Home / Self Care | Attending: Family Medicine | Admitting: Family Medicine

## 2017-09-14 DIAGNOSIS — M545 Low back pain, unspecified: Secondary | ICD-10-CM

## 2017-09-14 DIAGNOSIS — R109 Unspecified abdominal pain: Secondary | ICD-10-CM

## 2017-09-14 DIAGNOSIS — R300 Dysuria: Secondary | ICD-10-CM

## 2017-09-14 LAB — URINALYSIS, COMPLETE (UACMP) WITH MICROSCOPIC
Bacteria, UA: NONE SEEN
Bilirubin Urine: NEGATIVE
Glucose, UA: NEGATIVE mg/dL
KETONES UR: NEGATIVE mg/dL
Leukocytes, UA: NEGATIVE
Nitrite: NEGATIVE
Protein, ur: NEGATIVE mg/dL
Specific Gravity, Urine: 1.02 (ref 1.005–1.030)
WBC, UA: NONE SEEN WBC/hpf (ref 0–5)
pH: 6 (ref 5.0–8.0)

## 2017-09-14 MED ORDER — CYCLOBENZAPRINE HCL 10 MG PO TABS: 10.0000 mg | ORAL_TABLET | Freq: Two times a day (BID) | ORAL | 0 refills | Status: DC | PRN

## 2017-09-14 MED ORDER — MELOXICAM 15 MG PO TABS: 15.0000 mg | ORAL_TABLET | Freq: Every day | ORAL | 0 refills | Status: DC | PRN

## 2017-09-14 NOTE — ED Triage Notes (Signed)
Left flank pain and dysuria x1-2 weeks.

## 2017-09-14 NOTE — ED Provider Notes (Signed)
[facility] ____________________________________________  Time seen: Approximately 12:59 PM  I have reviewed the triage vital signs and the nursing notes.   HISTORY  Chief Complaint Flank Pain and Dysuria   HPI Molly Proctor is a 46 y.o. female presenting for evaluation of left lower back pain that is been present for last 1-2 weeks.  Denies known trigger.  States however that she does do a lot of bending, working outside in manual labor.  Denies fall, direct injury or direct trauma.  States that she was concerned about urinary trigger, she states that her left low back hurts when she urinates.  However denies any urinary frequency, urinary urgency, burning with urination, vaginal discomfort or vaginal discharge.  Denies any associated abdominal pain, fevers, vomiting or diarrhea.  Reports continues to eat and drink well.  No over-the-counter medications taken for the same complaints.  States pain is worse with movement.  States improves with being still.  Denies pain radiation, paresthesias, urinary or bowel retention or incontinence.  Denies chronic back pain. Denies chest pain, shortness of breath,  extremity pain, extremity swelling or rash. Denies recent sickness. Denies recent antibiotic use.   Shepherd-Banigan, Glenice Laineaniel Bowman, MD: PCP  No LMP recorded. Patient is not currently having periods (Reason: Irregular Periods).  Perimenopausal   Past Medical History:  Diagnosis Date  . Anxiety   . Bipolar 1 disorder (HCC)   . Depression   . PTSD (post-traumatic stress disorder)     Patient Active Problem List   Diagnosis Date Noted  . Substance induced mood disorder (HCC) 12/15/2015  . Delirium, drug-induced (HCC) 12/15/2015  . 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 PTSD (post-traumatic stress disorder) 12/15/2015  . Cocaine abuse (HCC) 12/15/2015  . Opiate abuse, episodic (HCC) 12/15/2015    Past Surgical History:  Procedure Laterality Date  . ABDOMINAL SURGERY    .  CHOLECYSTECTOMY    . KNEE SURGERY       No current facility-administered medications for this encounter.   Current Outpatient Medications:  .  butalbital-acetaminophen-caffeine (FIORICET, ESGIC) 50-325-40 MG per tablet, Take 1 tablet by mouth every 4 (four) hours as needed for headache., Disp: , Rfl:  .  gabapentin (NEURONTIN) 400 MG capsule, Take 400 mg by mouth 3 (three) times daily., Disp: , Rfl:  .  ibuprofen (ADVIL,MOTRIN) 800 MG tablet, Take 1 tablet (800 mg total) by mouth every 8 (eight) hours as needed., Disp: 30 tablet, Rfl: 0 .  cyclobenzaprine (FLEXERIL) 10 MG tablet, Take 1 tablet (10 mg total) by mouth 2 (two) times daily as needed for muscle spasms. Do not drive while taking as can cause drowsiness, Disp: 15 tablet, Rfl: 0 .  meloxicam (MOBIC) 15 MG tablet, Take 1 tablet (15 mg total) by mouth daily as needed., Disp: 10 tablet, Rfl: 0  Allergies Patient has no known allergies.  History reviewed. No pertinent family history.  Social History Social History   Tobacco Use  . Smoking status: Current Every Day Smoker  . Smokeless tobacco: Never Used  Substance Use Topics  . Alcohol use: No  . Drug use: Yes    Types: Marijuana    Review of Systems Constitutional: No fever/chills Cardiovascular: Denies chest pain. Respiratory: Denies shortness of breath. Gastrointestinal: No abdominal pain.   Genitourinary: As above.  Musculoskeletal: Positive for back pain. Skin: Negative for rash.   ____________________________________________   PHYSICAL EXAM:  VITAL SIGNS: ED Triage Vitals  Enc Vitals Group     BP 09/14/17 1219 (!) 126/59  Pulse Rate 09/14/17 1219 65     Resp 09/14/17 1219 16     Temp 09/14/17 1219 98.3 F (36.8 C)     Temp Source 09/14/17 1219 Oral     SpO2 09/14/17 1219 97 %     Weight 09/14/17 1221 178 lb (80.7 kg)     Height 09/14/17 1221 5\' 6"  (1.676 m)     Head Circumference --      Peak Flow --      Pain Score 09/14/17 1222 6     Pain  Loc --      Pain Edu? --      Excl. in GC? --     Constitutional: Alert and oriented. Well appearing and in no acute distress. Cardiovascular: Normal rate, regular rhythm. Grossly normal heart sounds.  Good peripheral circulation. Respiratory: Normal respiratory effort without tachypnea nor retractions. Breath sounds are clear and equal bilaterally. No wheezes, rales, rhonchi. Gastrointestinal: Soft and nontender.  Normal Bowel sounds. No CVA tenderness. Musculoskeletal:  No midline cervical or thoracic tenderness to palpation. Steady gait.  Changes positions quickly in room.  Minimal midline lower lumbar tenderness palpation, mild to moderate para lumbar left tenderness palpation, no swelling, no ecchymosis, skin intact, no pain with standing bilateral knee lifts, bilateral plantar flexion and dorsiflexion strong and equal.  Pain increases with lumbar flexion and extension as well as lumbar rotation right and left, left worse than right.  No saddle anesthesia.      Right lower leg:  No tenderness or edema.      Left lower leg:  No tenderness or edema.  Neurologic:  Normal speech and language. No gross focal neurologic deficits are appreciated. Speech is normal. No gait instability.  Skin:  Skin is warm, dry and intact. No rash noted. Psychiatric: Mood and affect are normal. Speech and behavior are normal. Patient exhibits appropriate insight and judgment   ___________________________________________   LABS (all labs ordered are listed, but only abnormal results are displayed)  Labs Reviewed  URINALYSIS, COMPLETE (UACMP) WITH MICROSCOPIC - Abnormal; Notable for the following components:      Result Value   Hgb urine dipstick TRACE (*)    Squamous Epithelial / LPF 0-5 (*)    All other components within normal limits  URINE CULTURE   PROCEDURES Procedures    INITIAL IMPRESSION / ASSESSMENT AND PLAN / ED COURSE  Pertinent labs & imaging results that were available during my care of  the patient were reviewed by me and considered in my medical decision making (see chart for details).  Well-appearing patient.  No acute distress.  Urinalysis reviewed.  Presenting for evaluation of left low back pain.  Urine culture added.  No history of kidney stones.  Doubt kidney stone.  Suspect lumbar strain injury.  As no direct trauma will defer x-ray, patient agrees.  Will empirically treat patient with oral Mobic and as needed Flexeril.  Encourage rest, stretching, ice and gradual increase of activity.Discussed indication, risks and benefits of medications with patient.  Discussed follow up with Primary care physician this week. Discussed follow up and return parameters including no resolution or any worsening concerns. Patient verbalized understanding and agreed to plan.   ____________________________________________   FINAL CLINICAL IMPRESSION(S) / ED DIAGNOSES  Final diagnoses:  Acute left-sided low back pain without sciatica     ED Discharge Orders        Ordered    meloxicam (MOBIC) 15 MG tablet  Daily PRN  09/14/17 1313    cyclobenzaprine (FLEXERIL) 10 MG tablet  2 times daily PRN     09/14/17 1313       Note: This dictation was prepared with Dragon dictation along with smaller phrase technology. Any transcriptional errors that result from this process are unintentional.         Renford Dills, NP 09/14/17 1404

## 2017-09-14 NOTE — Discharge Instructions (Signed)
Take medication as prescribed. Rest. Drink plenty of fluids. Stretch. Avoid strenuous activity. ° °Follow up with your primary care physician this week as needed. Return to Urgent care for new or worsening concerns.  ° °

## 2017-09-16 LAB — URINE CULTURE

## 2019-12-15 ENCOUNTER — Other Ambulatory Visit: Payer: Self-pay | Admitting: Orthopedic Surgery

## 2019-12-18 ENCOUNTER — Other Ambulatory Visit

## 2019-12-19 ENCOUNTER — Other Ambulatory Visit
Admission: RE | Admit: 2019-12-19 | Discharge: 2019-12-19 | Disposition: A | Discharge status: Home / Self Care | Source: Ambulatory Visit | Attending: Orthopedic Surgery | Admitting: Orthopedic Surgery

## 2019-12-19 DIAGNOSIS — Z20822 Contact with and (suspected) exposure to covid-19: Secondary | ICD-10-CM | POA: Insufficient documentation

## 2019-12-19 DIAGNOSIS — Z01812 Encounter for preprocedural laboratory examination: Secondary | ICD-10-CM | POA: Diagnosis not present

## 2019-12-19 LAB — SARS CORONAVIRUS 2 (TAT 6-24 HRS): SARS Coronavirus 2: NEGATIVE

## 2019-12-22 ENCOUNTER — Ambulatory Visit: Admit: 2019-12-22 | Admitting: Orthopedic Surgery

## 2019-12-22 ENCOUNTER — Ambulatory Visit: Admitting: Anesthesiology

## 2019-12-22 ENCOUNTER — Ambulatory Visit
Admission: RE | Admit: 2019-12-22 | Discharge: 2019-12-22 | Disposition: A | Discharge status: Home / Self Care | Attending: Orthopedic Surgery | Admitting: Orthopedic Surgery

## 2019-12-22 ENCOUNTER — Encounter: Payer: Self-pay | Admitting: Orthopedic Surgery

## 2019-12-22 ENCOUNTER — Encounter
Admission: RE | Disposition: A | Discharge status: Home / Self Care | Payer: Self-pay | Source: Home / Self Care | Attending: Orthopedic Surgery

## 2019-12-22 ENCOUNTER — Other Ambulatory Visit: Payer: Self-pay

## 2019-12-22 DIAGNOSIS — Z791 Long term (current) use of non-steroidal anti-inflammatories (NSAID): Secondary | ICD-10-CM | POA: Diagnosis not present

## 2019-12-22 DIAGNOSIS — F17210 Nicotine dependence, cigarettes, uncomplicated: Secondary | ICD-10-CM | POA: Diagnosis not present

## 2019-12-22 DIAGNOSIS — Z79899 Other long term (current) drug therapy: Secondary | ICD-10-CM | POA: Insufficient documentation

## 2019-12-22 DIAGNOSIS — F329 Major depressive disorder, single episode, unspecified: Secondary | ICD-10-CM | POA: Insufficient documentation

## 2019-12-22 DIAGNOSIS — S83232A Complex tear of medial meniscus, current injury, left knee, initial encounter: Secondary | ICD-10-CM | POA: Insufficient documentation

## 2019-12-22 DIAGNOSIS — K219 Gastro-esophageal reflux disease without esophagitis: Secondary | ICD-10-CM | POA: Insufficient documentation

## 2019-12-22 DIAGNOSIS — S83512A Sprain of anterior cruciate ligament of left knee, initial encounter: Secondary | ICD-10-CM | POA: Insufficient documentation

## 2019-12-22 DIAGNOSIS — X58XXXA Exposure to other specified factors, initial encounter: Secondary | ICD-10-CM | POA: Insufficient documentation

## 2019-12-22 DIAGNOSIS — Z419 Encounter for procedure for purposes other than remedying health state, unspecified: Secondary | ICD-10-CM

## 2019-12-22 HISTORY — PX: KNEE ARTHROSCOPY WITH ANTERIOR CRUCIATE LIGAMENT (ACL) REPAIR: SHX5644

## 2019-12-22 LAB — URINE DRUG SCREEN, QUALITATIVE (ARMC ONLY)
Amphetamines, Ur Screen: NOT DETECTED
Barbiturates, Ur Screen: POSITIVE — AB
Benzodiazepine, Ur Scrn: NOT DETECTED
Cannabinoid 50 Ng, Ur ~~LOC~~: NOT DETECTED
Cocaine Metabolite,Ur ~~LOC~~: NOT DETECTED
MDMA (Ecstasy)Ur Screen: NOT DETECTED
Methadone Scn, Ur: NOT DETECTED
Opiate, Ur Screen: NOT DETECTED
Phencyclidine (PCP) Ur S: NOT DETECTED
Tricyclic, Ur Screen: POSITIVE — AB

## 2019-12-22 LAB — POCT PREGNANCY, URINE: Preg Test, Ur: NEGATIVE

## 2019-12-22 SURGERY — RECONSTRUCTION, KNEE, ACL
Anesthesia: General | Site: Knee | Laterality: Left

## 2019-12-22 SURGERY — KNEE ARTHROSCOPY WITH ANTERIOR CRUCIATE LIGAMENT (ACL) REPAIR
Anesthesia: General | Site: Knee | Laterality: Left

## 2019-12-22 MED ORDER — LACTATED RINGERS IV SOLN: INTRAVENOUS | Status: DC

## 2019-12-22 MED ORDER — FENTANYL CITRATE (PF) 100 MCG/2ML IJ SOLN
INTRAMUSCULAR | Status: AC
  Filled 2019-12-22: qty 2

## 2019-12-22 MED ORDER — ONDANSETRON HCL 4 MG/2ML IJ SOLN: 4.0000 mg | Freq: Four times a day (QID) | INTRAMUSCULAR | Status: DC | PRN

## 2019-12-22 MED ORDER — LACTATED RINGERS IV SOLN
INTRAVENOUS | Status: DC | PRN
  Administered 2019-12-22: 4 mL

## 2019-12-22 MED ORDER — DEXAMETHASONE SODIUM PHOSPHATE 10 MG/ML IJ SOLN
INTRAMUSCULAR | Status: DC | PRN
  Administered 2019-12-22: 10 mg via INTRAVENOUS

## 2019-12-22 MED ORDER — FENTANYL CITRATE (PF) 100 MCG/2ML IJ SOLN
INTRAMUSCULAR | Status: AC
  Administered 2019-12-22: 25 ug via INTRAVENOUS
  Filled 2019-12-22: qty 2

## 2019-12-22 MED ORDER — ACETAMINOPHEN 325 MG PO TABS: 325.0000 mg | ORAL_TABLET | Freq: Four times a day (QID) | ORAL | Status: DC | PRN

## 2019-12-22 MED ORDER — SUGAMMADEX SODIUM 500 MG/5ML IV SOLN
INTRAVENOUS | Status: AC
  Filled 2019-12-22: qty 5

## 2019-12-22 MED ORDER — DEXMEDETOMIDINE HCL IN NACL 80 MCG/20ML IV SOLN
INTRAVENOUS | Status: AC
  Filled 2019-12-22: qty 20

## 2019-12-22 MED ORDER — ROCURONIUM BROMIDE 50 MG/5ML IV SOLN
INTRAVENOUS | Status: AC
  Filled 2019-12-22: qty 1

## 2019-12-22 MED ORDER — CHLORHEXIDINE GLUCONATE 4 % EX LIQD: 60.0000 mL | Freq: Once | CUTANEOUS | Status: DC

## 2019-12-22 MED ORDER — EPHEDRINE SULFATE 50 MG/ML IJ SOLN
INTRAMUSCULAR | Status: DC | PRN
  Administered 2019-12-22: 5 mg via INTRAVENOUS

## 2019-12-22 MED ORDER — BUPIVACAINE-EPINEPHRINE (PF) 0.25% -1:200000 IJ SOLN
INTRAMUSCULAR | Status: DC | PRN
  Administered 2019-12-22: 15 mL via PERINEURAL

## 2019-12-22 MED ORDER — FENTANYL CITRATE (PF) 100 MCG/2ML IJ SOLN: 50.0000 ug | Freq: Once | INTRAMUSCULAR | Status: AC

## 2019-12-22 MED ORDER — METOCLOPRAMIDE HCL 5 MG/ML IJ SOLN: 5.0000 mg | Freq: Three times a day (TID) | INTRAMUSCULAR | Status: DC | PRN

## 2019-12-22 MED ORDER — ACETAMINOPHEN 10 MG/ML IV SOLN
INTRAVENOUS | Status: AC
  Filled 2019-12-22: qty 100

## 2019-12-22 MED ORDER — BACITRACIN 50000 UNITS IM SOLR
INTRAMUSCULAR | Status: AC
  Filled 2019-12-22: qty 1

## 2019-12-22 MED ORDER — SUCCINYLCHOLINE CHLORIDE 20 MG/ML IJ SOLN
INTRAMUSCULAR | Status: DC | PRN
  Administered 2019-12-22: 100 mg via INTRAVENOUS

## 2019-12-22 MED ORDER — FAMOTIDINE 20 MG PO TABS
ORAL_TABLET | ORAL | Status: AC
  Filled 2019-12-22: qty 1

## 2019-12-22 MED ORDER — HYDROMORPHONE HCL 1 MG/ML IJ SOLN: 0.5000 mg | INTRAMUSCULAR | Status: DC | PRN

## 2019-12-22 MED ORDER — ROPIVACAINE HCL 5 MG/ML IJ SOLN
INTRAMUSCULAR | Status: AC
  Filled 2019-12-22: qty 20

## 2019-12-22 MED ORDER — PHENYLEPHRINE HCL (PRESSORS) 10 MG/ML IV SOLN
INTRAVENOUS | Status: DC | PRN
  Administered 2019-12-22 (×2): 100 ug via INTRAVENOUS

## 2019-12-22 MED ORDER — OXYCODONE HCL 5 MG PO TABS
ORAL_TABLET | ORAL | Status: AC
  Filled 2019-12-22: qty 1

## 2019-12-22 MED ORDER — FENTANYL CITRATE (PF) 100 MCG/2ML IJ SOLN
25.0000 ug | INTRAMUSCULAR | Status: AC | PRN
  Administered 2019-12-22 (×4): 25 ug via INTRAVENOUS

## 2019-12-22 MED ORDER — OXYCODONE HCL 5 MG PO TABS
ORAL_TABLET | ORAL | Status: AC
  Administered 2019-12-22: 5 mg via ORAL
  Filled 2019-12-22: qty 1

## 2019-12-22 MED ORDER — OXYCODONE HCL 5 MG PO TABS: 10.0000 mg | ORAL_TABLET | ORAL | Status: DC | PRN

## 2019-12-22 MED ORDER — MORPHINE SULFATE (PF) 10 MG/ML IV SOLN
INTRAVENOUS | Status: AC
  Filled 2019-12-22: qty 1

## 2019-12-22 MED ORDER — LIDOCAINE HCL (PF) 1 % IJ SOLN
INTRAMUSCULAR | Status: AC
  Filled 2019-12-22: qty 5

## 2019-12-22 MED ORDER — GLYCOPYRROLATE 0.2 MG/ML IJ SOLN
INTRAMUSCULAR | Status: DC | PRN
  Administered 2019-12-22: .2 mg via INTRAVENOUS

## 2019-12-22 MED ORDER — CEFAZOLIN SODIUM-DEXTROSE 2-4 GM/100ML-% IV SOLN
INTRAVENOUS | Status: AC
  Filled 2019-12-22: qty 100

## 2019-12-22 MED ORDER — SODIUM CHLORIDE 0.9 % IR SOLN: Status: DC | PRN

## 2019-12-22 MED ORDER — ONDANSETRON HCL 4 MG/2ML IJ SOLN
INTRAMUSCULAR | Status: AC
  Filled 2019-12-22: qty 4

## 2019-12-22 MED ORDER — ONDANSETRON HCL 4 MG PO TABS: 4.0000 mg | ORAL_TABLET | Freq: Four times a day (QID) | ORAL | Status: DC | PRN

## 2019-12-22 MED ORDER — OXYCODONE HCL 5 MG PO TABS
5.0000 mg | ORAL_TABLET | Freq: Once | ORAL | Status: AC
  Administered 2019-12-22: 5 mg via ORAL

## 2019-12-22 MED ORDER — FAMOTIDINE 20 MG PO TABS
20.0000 mg | ORAL_TABLET | Freq: Once | ORAL | Status: AC
  Administered 2019-12-22: 11:00:00 20 mg via ORAL

## 2019-12-22 MED ORDER — LIDOCAINE HCL (CARDIAC) PF 100 MG/5ML IV SOSY
PREFILLED_SYRINGE | INTRAVENOUS | Status: DC | PRN
  Administered 2019-12-22: 80 mg via INTRAVENOUS

## 2019-12-22 MED ORDER — KETOROLAC TROMETHAMINE 15 MG/ML IJ SOLN: 15.0000 mg | Freq: Four times a day (QID) | INTRAMUSCULAR | Status: DC

## 2019-12-22 MED ORDER — MIDAZOLAM HCL 2 MG/2ML IJ SOLN
INTRAMUSCULAR | Status: DC | PRN
  Administered 2019-12-22: 2 mg via INTRAVENOUS

## 2019-12-22 MED ORDER — PROPOFOL 10 MG/ML IV BOLUS
INTRAVENOUS | Status: DC | PRN
  Administered 2019-12-22: 150 mg via INTRAVENOUS

## 2019-12-22 MED ORDER — ROCURONIUM BROMIDE 100 MG/10ML IV SOLN
INTRAVENOUS | Status: DC | PRN
  Administered 2019-12-22: 10 mg via INTRAVENOUS
  Administered 2019-12-22 (×2): 20 mg via INTRAVENOUS
  Administered 2019-12-22: 30 mg via INTRAVENOUS
  Administered 2019-12-22: 20 mg via INTRAVENOUS
  Administered 2019-12-22 (×3): 10 mg via INTRAVENOUS

## 2019-12-22 MED ORDER — SUGAMMADEX SODIUM 500 MG/5ML IV SOLN
INTRAVENOUS | Status: DC | PRN
  Administered 2019-12-22: 400 mg via INTRAVENOUS

## 2019-12-22 MED ORDER — ROPIVACAINE HCL 5 MG/ML IJ SOLN
INTRAMUSCULAR | Status: AC
  Filled 2019-12-22: qty 30

## 2019-12-22 MED ORDER — ONDANSETRON HCL 4 MG/2ML IJ SOLN
INTRAMUSCULAR | Status: DC | PRN
  Administered 2019-12-22 (×2): 4 mg via INTRAVENOUS

## 2019-12-22 MED ORDER — OXYCODONE HCL 5 MG PO TABS: 5.0000 mg | ORAL_TABLET | ORAL | Status: DC | PRN

## 2019-12-22 MED ORDER — MIDAZOLAM HCL 2 MG/2ML IJ SOLN: 1.0000 mg | Freq: Once | INTRAMUSCULAR | Status: AC

## 2019-12-22 MED ORDER — ACETAMINOPHEN 10 MG/ML IV SOLN
INTRAVENOUS | Status: DC | PRN
  Administered 2019-12-22: 1000 mg via INTRAVENOUS

## 2019-12-22 MED ORDER — MIDAZOLAM HCL 2 MG/2ML IJ SOLN
INTRAMUSCULAR | Status: AC
  Administered 2019-12-22: 1 mg via INTRAVENOUS
  Filled 2019-12-22: qty 2

## 2019-12-22 MED ORDER — DEXMEDETOMIDINE HCL IN NACL 200 MCG/50ML IV SOLN
INTRAVENOUS | Status: DC | PRN
  Administered 2019-12-22: 8 ug via INTRAVENOUS
  Administered 2019-12-22: 12 ug via INTRAVENOUS

## 2019-12-22 MED ORDER — EPINEPHRINE PF 1 MG/ML IJ SOLN
INTRAMUSCULAR | Status: AC
  Filled 2019-12-22: qty 4

## 2019-12-22 MED ORDER — MIDAZOLAM HCL 2 MG/2ML IJ SOLN
INTRAMUSCULAR | Status: AC
  Filled 2019-12-22: qty 2

## 2019-12-22 MED ORDER — BUPIVACAINE HCL (PF) 0.25 % IJ SOLN
INTRAMUSCULAR | Status: AC
  Filled 2019-12-22: qty 30

## 2019-12-22 MED ORDER — METOCLOPRAMIDE HCL 10 MG PO TABS: 5.0000 mg | ORAL_TABLET | Freq: Three times a day (TID) | ORAL | Status: DC | PRN

## 2019-12-22 MED ORDER — PROMETHAZINE HCL 25 MG/ML IJ SOLN: 6.2500 mg | INTRAMUSCULAR | Status: DC | PRN

## 2019-12-22 MED ORDER — SODIUM CHLORIDE FLUSH 0.9 % IV SOLN
INTRAVENOUS | Status: AC
  Filled 2019-12-22: qty 10

## 2019-12-22 MED ORDER — OXYCODONE-ACETAMINOPHEN 10-325 MG PO TABS: 1.0000 | ORAL_TABLET | Freq: Four times a day (QID) | ORAL | 0 refills | Status: AC | PRN

## 2019-12-22 MED ORDER — FENTANYL CITRATE (PF) 100 MCG/2ML IJ SOLN
INTRAMUSCULAR | Status: DC | PRN
  Administered 2019-12-22 (×2): 100 ug via INTRAVENOUS

## 2019-12-22 MED ORDER — ESMOLOL HCL 100 MG/10ML IV SOLN
INTRAVENOUS | Status: DC | PRN
  Administered 2019-12-22: 20 mg via INTRAVENOUS

## 2019-12-22 MED ORDER — FENTANYL CITRATE (PF) 100 MCG/2ML IJ SOLN
INTRAMUSCULAR | Status: AC
  Administered 2019-12-22: 50 ug via INTRAVENOUS
  Filled 2019-12-22: qty 2

## 2019-12-22 MED ORDER — OXYCODONE HCL 5 MG PO TABS: 5.0000 mg | ORAL_TABLET | Freq: Once | ORAL | Status: AC | PRN

## 2019-12-22 MED ORDER — KETOROLAC TROMETHAMINE 15 MG/ML IJ SOLN
INTRAMUSCULAR | Status: AC
  Administered 2019-12-22: 15 mg via INTRAVENOUS
  Filled 2019-12-22: qty 1

## 2019-12-22 MED ORDER — CEFAZOLIN SODIUM-DEXTROSE 2-4 GM/100ML-% IV SOLN
2.0000 g | INTRAVENOUS | Status: AC
  Administered 2019-12-22: 13:00:00 2 g via INTRAVENOUS

## 2019-12-22 SURGICAL SUPPLY — 104 items
ADAPTER IRRIG TUBE 2 SPIKE SOL (ADAPTER) ×6 IMPLANT
BIT DRILL BADGER 8.5 (BIT) ×3 IMPLANT
BIT DRILL BADGER 9.5 (BIT) ×3 IMPLANT
BIT DRILL CANN SENTINAL 9 (BIT) ×2 IMPLANT
BIT DRILL CANN SENTINAL 9MM (BIT) ×1
BIT DRILL SENTINEL LF 9.5 (BIT) ×3 IMPLANT
BLADE FULL RADIUS 3.5 (BLADE) IMPLANT
BLADE SHAVER 4.5 DBL SERAT CV (CUTTER) ×3 IMPLANT
BLADE SURG 15 STRL LF DISP TIS (BLADE) ×1 IMPLANT
BLADE SURG 15 STRL SS (BLADE) ×2
BLADE SURG SZ10 CARB STEEL (BLADE) ×3 IMPLANT
BLADE SURG SZ11 CARB STEEL (BLADE) ×3 IMPLANT
BNDG COHESIVE 4X5 TAN STRL (GAUZE/BANDAGES/DRESSINGS) ×3 IMPLANT
BNDG COHESIVE 6X5 TAN STRL LF (GAUZE/BANDAGES/DRESSINGS) ×3 IMPLANT
BNDG ELASTIC 6X5.8 VLCR STR LF (GAUZE/BANDAGES/DRESSINGS) IMPLANT
BNDG ESMARK 6X12 TAN STRL LF (GAUZE/BANDAGES/DRESSINGS) IMPLANT
BRACE KNEE POST OP SHORT (BRACE) ×3 IMPLANT
BRUSH SCRUB EZ  4% CHG (MISCELLANEOUS) ×2
BRUSH SCRUB EZ 4% CHG (MISCELLANEOUS) ×1 IMPLANT
BUTTON LOOP FEMORAL INFINITY (Button) ×6 IMPLANT
CANNULA SHOE HORN (MISCELLANEOUS) ×3 IMPLANT
CHLORAPREP W/TINT 26 (MISCELLANEOUS) ×3 IMPLANT
CLEANER CAUTERY TIP 5X5 PAD (MISCELLANEOUS) IMPLANT
COOLER POLAR GLACIER W/PUMP (MISCELLANEOUS) ×3 IMPLANT
COVER BACK TABLE REUSABLE LG (DRAPES) ×3 IMPLANT
COVER MAYO STAND REUSABLE (DRAPES) ×3 IMPLANT
COVER WAND RF STERILE (DRAPES) ×3 IMPLANT
CUFF TOURN SGL QUICK 24 (TOURNIQUET CUFF)
CUFF TOURN SGL QUICK 30 (TOURNIQUET CUFF) ×2
CUFF TRNQT CYL 24X4X16.5-23 (TOURNIQUET CUFF) IMPLANT
CUFF TRNQT CYL 30X4X21-28X (TOURNIQUET CUFF) ×1 IMPLANT
DRAPE 3/4 80X56 (DRAPES) ×3 IMPLANT
DRAPE IMP U-DRAPE 54X76 (DRAPES) ×3 IMPLANT
DRAPE POUCH INSTRU U-SHP 10X18 (DRAPES) IMPLANT
DRAPE U-SHAPE 47X51 STRL (DRAPES) ×3 IMPLANT
ELECT REM PT RETURN 9FT ADLT (ELECTROSURGICAL) ×3
ELECTRODE REM PT RTRN 9FT ADLT (ELECTROSURGICAL) ×1 IMPLANT
GAUZE 4X4 16PLY RFD (DISPOSABLE) ×3 IMPLANT
GAUZE SPONGE 4X4 12PLY STRL (GAUZE/BANDAGES/DRESSINGS) ×3 IMPLANT
GAUZE XEROFORM 1X8 LF (GAUZE/BANDAGES/DRESSINGS) ×3 IMPLANT
GLOVE BIOGEL PI IND STRL 8.5 (GLOVE) ×1 IMPLANT
GLOVE BIOGEL PI INDICATOR 8.5 (GLOVE) ×2
GLOVE INDICATOR 8.0 STRL GRN (GLOVE) ×3 IMPLANT
GLOVE SURG ORTHO 8.0 STRL STRW (GLOVE) ×6 IMPLANT
GOWN STRL REUS W/ TWL LRG LVL3 (GOWN DISPOSABLE) ×2 IMPLANT
GOWN STRL REUS W/ TWL XL LVL3 (GOWN DISPOSABLE) ×1 IMPLANT
GOWN STRL REUS W/TWL LRG LVL3 (GOWN DISPOSABLE) ×4
GOWN STRL REUS W/TWL XL LVL3 (GOWN DISPOSABLE) ×2
GRADUATE 1200CC STRL 31836 (MISCELLANEOUS) IMPLANT
GUIDE PIN SPADE TIP INFINITY (PIN) ×3
GUIDEWIRE 1.2MMX18 (WIRE) ×3 IMPLANT
HANDLE YANKAUER SUCT BULB TIP (MISCELLANEOUS) ×3 IMPLANT
IMMBOLIZER KNEE 19 BLUE UNIV (SOFTGOODS) ×3 IMPLANT
IV LACTATED RINGER IRRG 3000ML (IV SOLUTION) ×12
IV LR IRRIG 3000ML ARTHROMATIC (IV SOLUTION) ×6 IMPLANT
KIT TURNOVER KIT A (KITS) ×3 IMPLANT
LABEL OR SOLS (LABEL) IMPLANT
MANIFOLD NEPTUNE II (INSTRUMENTS) ×3 IMPLANT
MAT ABSORB  FLUID 56X50 GRAY (MISCELLANEOUS) ×2
MAT ABSORB FLUID 56X50 GRAY (MISCELLANEOUS) ×1 IMPLANT
NDL SAFETY ECLIPSE 18X1.5 (NEEDLE) ×1 IMPLANT
NEEDLE HYPO 18GX1.5 SHARP (NEEDLE) ×2
NEEDLE SPNL 20GX3.5 QUINCKE YW (NEEDLE) ×3 IMPLANT
PACK ARTHROSCOPY KNEE (MISCELLANEOUS) ×3 IMPLANT
PAD ABD DERMACEA PRESS 5X9 (GAUZE/BANDAGES/DRESSINGS) ×6 IMPLANT
PAD CLEANER CAUTERY TIP 5X5 (MISCELLANEOUS)
PAD WRAPON POLAR KNEE (MISCELLANEOUS) ×1 IMPLANT
PENCIL ELECTRO HAND CTR (MISCELLANEOUS) IMPLANT
PIN GUIDE SPADE TIP INFINITY (PIN) ×1 IMPLANT
SCREW GENESYS LF 9X25 (Screw) ×3 IMPLANT
STAPLE SPIKE LIGAMENT 8X20MM (Staple) ×3 IMPLANT
STAPLER SKIN PROX 35W (STAPLE) ×3 IMPLANT
SUCTION FRAZIER HANDLE 10FR (MISCELLANEOUS) ×2
SUCTION TUBE FRAZIER 10FR DISP (MISCELLANEOUS) ×1 IMPLANT
SUT 2 FIBERLOOP 20 STRT BLUE (SUTURE) ×6
SUT ETHILON 4-0 (SUTURE) ×2
SUT ETHILON 4-0 FS2 18XMFL BLK (SUTURE) ×1
SUT ETHILON NAB PS2 4-0 18IN (SUTURE) ×3 IMPLANT
SUT FIBERSNARE 2 CLSD LOOP (SUTURE) ×3 IMPLANT
SUT FIBERWIRE #2 38 T-5 BLUE (SUTURE)
SUT LOOP PASSING HIFI (SUTURE) ×3 IMPLANT
SUT VIC AB 0 CT1 36 (SUTURE) ×3 IMPLANT
SUT VIC AB 1 CT1 36 (SUTURE) IMPLANT
SUT VIC AB 2-0 SH 27 (SUTURE) ×2
SUT VIC AB 2-0 SH 27XBRD (SUTURE) ×1 IMPLANT
SUT VIC AB 3-0 PS2 18 (SUTURE) IMPLANT
SUTURE 2 FIBERLOOP 20 STRT BLU (SUTURE) ×2 IMPLANT
SUTURE ETHLN 4-0 FS2 18XMF BLK (SUTURE) ×1 IMPLANT
SUTURE FIBERWR #2 38 T-5 BLUE (SUTURE) IMPLANT
SYR 10ML LL (SYRINGE) ×6 IMPLANT
SYR BULB IRRIG 60ML STRL (SYRINGE) ×3 IMPLANT
TENDON ANTERIOR TIBIALIS (Tissue) ×3 IMPLANT
TOWEL OR 17X26 4PK STRL BLUE (TOWEL DISPOSABLE) ×9 IMPLANT
TUBING ARTHRO INFLOW-ONLY STRL (TUBING) ×3 IMPLANT
TUBING CONNECTING 10 (TUBING) ×2 IMPLANT
TUBING CONNECTING 10' (TUBING) ×1
WAND WEREWOLF FLOW 90D (MISCELLANEOUS) IMPLANT
WRAPON POLAR PAD KNEE (MISCELLANEOUS) ×3
badger cannulated drill bit 8.5x229mm ×2 IMPLANT
badger cannulated drill bit 9.5x229mm ×3 IMPLANT
hifi passing loop 4.5" loop #2 ×2 IMPLANT
infinity 3.5 mm spade tip guide pin ×2 IMPLANT
sentinel cannulated drill bit 9.5x229mm ×2 IMPLANT
sentinel cannulated drill bit 9x229mm ×3 IMPLANT

## 2019-12-22 NOTE — Transfer of Care (Signed)
Immediate Anesthesia Transfer of Care Note  Patient: Molly Proctor  Procedure(s) Performed: RECONSTRUCTION ANTERIOR CRUCIATE LIGAMENT WITH ALLOGRAFT PARTIAL MEDIAL MENISCETOMY PARTIAL SYNOVECTOMY (Left Knee)  Patient Location: PACU  Anesthesia Type:General  Level of Consciousness: awake, alert , oriented and patient cooperative  Airway & Oxygen Therapy: Patient Spontanous Breathing and Patient connected to face mask oxygen  Post-op Assessment: Report given to RN and Post -op Vital signs reviewed and stable  Post vital signs: Reviewed and stable  Last Vitals:  Vitals Value Taken Time  BP 131/72 12/22/19 1510  Temp    Pulse 94 12/22/19 1512  Resp 17 12/22/19 1512  SpO2 98 % 12/22/19 1512  Vitals shown include unvalidated device data.  Last Pain:  Vitals:   12/22/19 1211  TempSrc:   PainSc: 0-No pain      Patients Stated Pain Goal: 2 (12/22/19 1019)  Complications: No apparent anesthesia complications

## 2019-12-22 NOTE — Anesthesia Procedure Notes (Signed)
Anesthesia Regional Block: Adductor canal block   Pre-Anesthetic Checklist: ,, timeout performed, Correct Patient, Correct Site, Correct Laterality, Correct Procedure, Correct Position, site marked, Risks and benefits discussed,  Surgical consent,  Pre-op evaluation,  At surgeon's request and post-op pain management  Laterality: Left  Prep: chloraprep       Needles:  Injection technique: Single-shot  Needle Type: Stimiplex     Needle Length: 9cm  Needle Gauge: 21     Additional Needles:   Procedures:,,,, ultrasound used (permanent image in chart),,,,  Narrative:  Start time: 12/22/2019 12:07 PM End time: 12/22/2019 12:11 PM  Performed by: Personally  Anesthesiologist: Karleen Hampshire, MD

## 2019-12-22 NOTE — Anesthesia Preprocedure Evaluation (Signed)
Anesthesia Evaluation  Patient identified by MRN, date of birth, ID band Patient awake    Reviewed: Allergy & Precautions, H&P , NPO status , Patient's Chart, lab work & pertinent test results  Airway Mallampati: II  TM Distance: >3 FB Neck ROM: full    Dental  (+) Chipped, Missing   Pulmonary Current Smoker and Patient abstained from smoking.,           Cardiovascular negative cardio ROS       Neuro/Psych PSYCHIATRIC DISORDERS Anxiety Depression Bipolar Disorder negative neurological ROS     GI/Hepatic Neg liver ROS, GERD  Controlled,  Endo/Other  negative endocrine ROS  Renal/GU      Musculoskeletal   Abdominal   Peds  Hematology negative hematology ROS (+)   Anesthesia Other Findings Past Medical History: No date: Anxiety No date: Bipolar 1 disorder (HCC) No date: Depression No date: PTSD (post-traumatic stress disorder)  Past Surgical History: No date: ABDOMINAL SURGERY No date: CHOLECYSTECTOMY No date: KNEE SURGERY  BMI    Body Mass Index: 30.02 kg/m      Reproductive/Obstetrics negative OB ROS                             Anesthesia Physical Anesthesia Plan  ASA: II  Anesthesia Plan: General ETT   Post-op Pain Management:    Induction:   PONV Risk Score and Plan: Ondansetron, Dexamethasone, Midazolam and Treatment may vary due to age or medical condition  Airway Management Planned:   Additional Equipment:   Intra-op Plan:   Post-operative Plan:   Informed Consent: I have reviewed the patients History and Physical, chart, labs and discussed the procedure including the risks, benefits and alternatives for the proposed anesthesia with the patient or authorized representative who has indicated his/her understanding and acceptance.     Dental Advisory Given  Plan Discussed with: Anesthesiologist  Anesthesia Plan Comments:         Anesthesia Quick  Evaluation

## 2019-12-22 NOTE — Op Note (Signed)
12/22/2019  3:06 PM  Patient:   Molly Proctor  Pre-Op Diagnosis:   Anterior cruciate ligament tear, left knee.  Post-Op Diagnosis:   Same and medial meniscus tear, left knee  Procedure:   Arthroscopically assisted anterior cruciate ligament reconstruction using tibialis anterior allograft and partial medial menisectomy left knee.  Surgeon:  Kurtis Bushman, MD  Assistant:  Carlynn Spry, PA-C  Anesthesia:   General anesthesia with a adductor canal nerve block placed preoperatively by the anesthesiologist.  Findings:   As above.  Complications:   None  EBL:   10 cc  TT:   None used  Drains:   None  Implants:   Arthrex staple 8 mm, Conmed Femoral button and biocomposite 9 mm by 25 mm tibial screw, tibialis anterior allograft 9.5 mm  Brief Clinical Note:   The patient is a 49 year old woman with left knee pain and instability that has not responded to conservative treatment. The patient presents at this time for arthroscopy, partial medial menisectomy and reconstruction of the anterior cruciate ligament using allograft.  Procedure:   The patient underwent placement of a femoral nerve block in the preoperative holding area being brought into the operating room and lain in the supine position. After adequate general laryngal mask anesthesia was obtained, a timeout was performed to verify the appropriate surgical site and side. An examination under anesthesia demonstrated a 2+ Lachman and a 1+ pivot shift. The left lower extremity was prepped with ChloraPrep solution before being draped sterilely. Preoperative antibiotics were administered. The limb was exsanguinated with an Esmarch and the tourniquet inflated to 250 mmHg. A total of 20 cc of 0.5% Sensorcaine with epinephrine was injected into the expected incision sites and portal sites.  The arthroscopic portion of the procedure was begun. Subcutaneous anteromedial and anterolateral portal was created before the camera was placed in the  anterolateral portal and instrumentation performed through the anteromedial portal. The knee was sequentially examined beginning in the suprapatellar pouch, then progressing to the patellofemoral space, the medial gutter compartment, the notch, and finally the lateral compartment and gutter. Abundant reactive synovial tissues anteriorly were debrided in order to improve visualization. The findings were as described above. There was complete disruption of the ACL and complex tearing of the posterior horn of the medial meniscus.  Attention was redirected to the notch. The remnants of the anterior cruciate ligament were debrided from the femoral and tibial sides. The epiphyseal femoral guide was introduced and a 9.5 mm diameter by 30 mm length tunnel was created with the flip-cutter. The tibial guide was positioned and the flip-cutter drilled up through the proximal tibia into the notch. After verifying its position, a 9.5 mm diameter by 35 mm depth tunnel was created. Care was taken to protect the femoral and tibial physis at all times. The bony debris was removed using the full-radius resector. The graft was passed using a fiber loop and the femoral side was tightened.   The distal portion was then passed through the tibial tunnel and the knee placed in 15 degrees of flexion with a posterior drawer. The tibial fixation consisted of a button with a Arthrex staple backup.   Once the graft was secured, a gentle Lachman maneuver demonstrated excellent stability with less than 2 mm of anterior displacement. A gentle pivot shift was negative.  The scope was reintroduced to be sure that there was no impingement in extension. The superficial retinacular layer was reapproximated using 2-0 Vicryl interrupted sutures. The subcutaneous tissues  were closed in two layers using 2-0 Vicryl interrupted sutures before the skin was closed using staples. Portal sites were closed with 4-0 nylon. A sterile bulky dressing was  applied to the knee, incorporating a Polar Care pad, before the patient was placed into a knee immobilizer. The patient was then awakened, extubated, and returned to the recovery room in satisfactory condition after tolerating the procedure well.

## 2019-12-22 NOTE — Anesthesia Procedure Notes (Signed)
Procedure Name: Intubation Performed by: Fletcher-Harrison, Mende Biswell, CRNA Pre-anesthesia Checklist: Patient identified, Emergency Drugs available, Suction available and Patient being monitored Patient Re-evaluated:Patient Re-evaluated prior to induction Oxygen Delivery Method: Circle system utilized Preoxygenation: Pre-oxygenation with 100% oxygen Induction Type: IV induction Ventilation: Mask ventilation without difficulty Laryngoscope Size: McGraph and 3 Grade View: Grade I Tube type: Oral Tube size: 7.0 mm Number of attempts: 1 Airway Equipment and Method: Stylet Placement Confirmation: ETT inserted through vocal cords under direct vision,  positive ETCO2,  CO2 detector and breath sounds checked- equal and bilateral Secured at: 21 cm Tube secured with: Tape Dental Injury: Teeth and Oropharynx as per pre-operative assessment        

## 2019-12-22 NOTE — H&P (Signed)
The patient has been re-examined, and the chart reviewed, and there have been no interval changes to the documented history and physical.  Plan a left knee arthroscopic ACL reconstruction today.  Anesthesia is consulted regarding a peripheral nerve block for post-operative pain.  The risks, benefits, and alternatives have been discussed at length, and the patient is willing to proceed.

## 2019-12-22 NOTE — Discharge Instructions (Addendum)
Post Op Home Instructions for Knee Arthroscopy  1.) Knee immobilizer on whenever standing or walking.  You should have your legs elevated (higher than your heart) in a recliner chair or couch.  2) You may be up walking around as tolerated but should take periodic breaks to elevate your legs.  Discontinue use of crutches when you feel you are able to walk without pain or a limp.  3) Pump your feet up and down when possible.  4) You may remove the Ace wrap and dressings three days after surgery.  Place band aids over the incision sites.  5) You may shower after you remove the surgical dressing.  You do not need to cover the incision with plastic wrap.  The incision can get wet, but do not submerge under water.  After your sutures have been removed, you should wait 24 hours before submerging incision under water.  6) Pain medication can cause constipation.  You should increase your fluid intake, increase your intake of high fiber foods and/or take Metamucil as needed for constipation.  7) Continue your physical therapy exercises, as shown at the office, at least twice daily.  You should set up outpatient physical therapy and start within the first week after surgery.  8) Continue to use your Polar Pack continuously for 2-3 days after surgery.  After you remove the surgical dressing, it is a good idea to use your Polar Pack or ice pack for 30 minutes after doing your exercises to reduce swelling.  9) Do not be surprised if you have increased pain at night.  This usually means you have been a little too active during the day and need to reduce your activities.  10) If you develop lower extremity swelling that does not improve after a night of elevation, please call the office.  This could be an early sign of a blood clot.  Please call with any questions at [phone]     AMBULATORY SURGERY  DISCHARGE INSTRUCTIONS   1) The drugs that you were given will stay in your system until tomorrow  so for the next 24 hours you should not:  A) Drive an automobile B) Make any legal decisions C) Drink any alcoholic beverage   2) You may resume regular meals tomorrow.  Today it is better to start with liquids and gradually work up to solid foods.  You may eat anything you prefer, but it is better to start with liquids, then soup and crackers, and gradually work up to solid foods.   3) Please notify your doctor immediately if you have any unusual bleeding, trouble breathing, redness and pain at the surgery site, drainage, fever, or pain not relieved by medication.    4) Additional Instructions:        Please contact your physician with any problems or Same Day Surgery at [phone], Monday through Friday 6 am to 4 pm, or [facility] number at [phone].

## 2019-12-22 NOTE — Anesthesia Postprocedure Evaluation (Signed)
Anesthesia Post Note  Patient: Delana Meyer  Procedure(s) Performed: RECONSTRUCTION ANTERIOR CRUCIATE LIGAMENT WITH ALLOGRAFT PARTIAL MEDIAL MENISCETOMY PARTIAL SYNOVECTOMY (Left Knee)  Patient location during evaluation: PACU Anesthesia Type: General Level of consciousness: awake and alert Pain management: pain level controlled Vital Signs Assessment: post-procedure vital signs reviewed and stable Respiratory status: spontaneous breathing, nonlabored ventilation, respiratory function stable and patient connected to nasal cannula oxygen Cardiovascular status: blood pressure returned to baseline and stable Postop Assessment: no apparent nausea or vomiting Anesthetic complications: no     Last Vitals:  Vitals:   12/22/19 1610 12/22/19 1619  BP: 135/84 133/71  Pulse: 77 73  Resp: (!) 23 18  Temp:  36.9 C  SpO2: 95% 97%    Last Pain:  Vitals:   12/22/19 1619  TempSrc:   PainSc: 1                  Cleda Mccreedy Melvinia Ashby

## 2021-06-12 ENCOUNTER — Other Ambulatory Visit: Payer: Self-pay

## 2021-06-12 ENCOUNTER — Ambulatory Visit
Admission: EM | Admit: 2021-06-12 | Discharge: 2021-06-12 | Disposition: A | Discharge status: Home / Self Care | Attending: Internal Medicine | Admitting: Internal Medicine

## 2021-06-12 DIAGNOSIS — B370 Candidal stomatitis: Secondary | ICD-10-CM | POA: Diagnosis not present

## 2021-06-12 DIAGNOSIS — J0100 Acute maxillary sinusitis, unspecified: Secondary | ICD-10-CM | POA: Diagnosis not present

## 2021-06-12 MED ORDER — NYSTATIN 100000 UNIT/ML MT SUSP: 5.0000 mL | Freq: Four times a day (QID) | OROMUCOSAL | 0 refills | Status: DC

## 2021-06-12 MED ORDER — AMOXICILLIN-POT CLAVULANATE 875-125 MG PO TABS: 1.0000 | ORAL_TABLET | Freq: Two times a day (BID) | ORAL | 0 refills | Status: DC

## 2021-06-12 MED ORDER — FLUTICASONE PROPIONATE 50 MCG/ACT NA SUSP: 2.0000 | Freq: Every day | NASAL | 0 refills | Status: DC

## 2021-06-12 NOTE — Discharge Instructions (Addendum)
Go to ER if you get worse in the next 2-3 days

## 2021-06-12 NOTE — ED Provider Notes (Signed)
[facility]    CSN: [id number] Arrival date & time: 06/12/21  1051      History   Chief Complaint Chief Complaint  Patient presents with   Sore Throat   chest congestion    HPI Molly Proctor is a 50 y.o. female who presents with unresolved chest congestion, ST, HA and puffy eyes. Her husband and her had respiratory illness 2 weeks ago, and he husband was in the hospital with septicemia. She has not had a fever since the initial time she was sick 2 weeks ago. She is a smoker.     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   PTSD (post-traumatic stress disorder) 12/15/2015   Cocaine abuse (HCC) 12/15/2015   Opiate abuse, episodic (HCC) 12/15/2015    Past Surgical History:  Procedure Laterality Date   ABDOMINAL SURGERY     CHOLECYSTECTOMY     KNEE ARTHROSCOPY WITH ANTERIOR CRUCIATE LIGAMENT (ACL) REPAIR Left 12/22/2019   Procedure: RECONSTRUCTION ANTERIOR CRUCIATE LIGAMENT WITH ALLOGRAFT PARTIAL MEDIAL MENISCETOMY PARTIAL SYNOVECTOMY;  Surgeon: Lyndle Herrlich, MD;  Location: [facility] ORS;  Service: Orthopedics;  Laterality: Left;   KNEE SURGERY      OB History     Gravida  4   Para      Term      Preterm      AB  1   Living  2      SAB  1   IAB      Ectopic      Multiple      Live Births               Home Medications    Prior to Admission medications   Medication Sig Start Date End Date Taking? Authorizing Provider  amoxicillin-clavulanate (AUGMENTIN) 875-125 MG tablet Take 1 tablet by mouth every 12 (twelve) hours. 06/12/21  Yes Rodriguez-Southworth, Nettie Elm, PA-C  fluticasone (FLONASE) 50 MCG/ACT nasal spray Place 2 sprays into both nostrils daily. 06/12/21  Yes  Rodriguez-Southworth, Nettie Elm, PA-C  nystatin (MYCOSTATIN) 100000 UNIT/ML suspension Take 5 mLs (500,000 Units total) by mouth 4 (four) times daily. For 7 days for thrush 06/12/21  Yes Rodriguez-Southworth, Nettie Elm, PA-C  amitriptyline (ELAVIL) 10 MG tablet Take 10 mg by mouth at bedtime.    [provider]  ARIPiprazole (ABILIFY) 2 MG tablet Take 2 mg by mouth daily.    [provider]  Aspirin-Caffeine (BC FAST PAIN RELIEF PO) Take 1 packet by mouth daily as needed (pain).    [provider]  butalbital-acetaminophen-caffeine (FIORICET, ESGIC) 50-325-40 MG per tablet Take 1 tablet by mouth every 4 (four) hours as needed for headache.    [provider]  celecoxib (CELEBREX) 200 MG capsule Take 200 mg by mouth daily.    [provider]  escitalopram (LEXAPRO) 20 MG tablet Take 20 mg by mouth daily.    [provider]  gabapentin (NEURONTIN) 300 MG capsule Take 900 mg by mouth at bedtime.    [provider]  Melatonin 10 MG CAPS Take 10 mg by mouth at bedtime as needed (sleep).    [provider]  rOPINIRole (REQUIP) 0.5 MG tablet Take 1.5 mg by  mouth at bedtime.    [provider]  tiZANidine (ZANAFLEX) 4 MG tablet Take 4 mg by mouth See admin instructions. Take 4 mg at night, may take a second 4 mg dose as needed for muscle spasms    [provider]    Family History History reviewed. No pertinent family history.  Social History Social History   Tobacco Use   Smoking status: Every Day    Packs/day: 1.00    Years: 30.00    Pack years: 30.00    Types: Cigarettes   Smokeless tobacco: Never  Substance Use Topics   Alcohol use: No   Drug use: Yes    Types: Marijuana    Comment: Has not used in atleast one year.     Allergies   Patient has no known allergies.   Review of Systems Review of Systems  Constitutional:  Positive for fatigue. Negative for appetite change, chills, diaphoresis and  fever.  HENT:  Positive for congestion, postnasal drip, sinus pressure, sinus pain and sore throat. Negative for ear discharge, ear pain, mouth sores, rhinorrhea and trouble swallowing.   Eyes:  Negative for pain, discharge, redness and itching.       Has puffiness on lower orbits   Respiratory:  Positive for cough. Negative for chest tightness, shortness of breath and wheezing.   Cardiovascular:  Negative for chest pain.  Gastrointestinal:  Negative for diarrhea, nausea and vomiting.  Musculoskeletal:  Negative for myalgias.  Skin:  Negative for rash.  Neurological:  Positive for headaches.  Hematological:  Negative for adenopathy.    Physical Exam Triage Vital Signs ED Triage Vitals  Enc Vitals Group     BP 06/12/21 1101 (!) 155/72     Pulse Rate 06/12/21 1101 85     Resp 06/12/21 1101 18     Temp 06/12/21 1101 98.6 F (37 C)     Temp Source 06/12/21 1101 Oral     SpO2 --      Weight 06/12/21 1100 178 lb (80.7 kg)     Height 06/12/21 1100 5\' 6"  (1.676 m)     Head Circumference --      Peak Flow --      Pain Score 06/12/21 1059 6     Pain Loc --      Pain Edu? --      Excl. in GC? --    No data found.  Updated Vital Signs BP (!) 155/72 (BP Location: Left Arm)   Pulse 85   Temp 98.6 F (37 C) (Oral)   Resp 18   Ht 5\' 6"  (1.676 m)   Wt 178 lb (80.7 kg)   LMP 02/16/2017 (Approximate)   BMI 28.73 kg/m   Visual Acuity Right Eye Distance:   Left Eye Distance:   Bilateral Distance:    Right Eye Near:   Left Eye Near:    Bilateral Near:     Physical Exam Vitals and nursing note reviewed.  Constitutional:      Appearance: She is normal weight.  HENT:     Head: Normocephalic.     Right Ear: Tympanic membrane and ear canal normal.     Left Ear: Tympanic membrane and ear canal normal.     Nose: Congestion present.     Comments: Has tenderness of maxillary sinuses    Mouth/Throat:     Mouth: Mucous membranes are moist.     Pharynx: Oropharynx is clear. No uvula  swelling.     Comments: Has  white patches on the roof of her mouth Eyes:     Conjunctiva/sclera: Conjunctivae normal.  Cardiovascular:     Rate and Rhythm: Normal rate and regular rhythm.     Heart sounds: No murmur heard. Pulmonary:     Effort: Pulmonary effort is normal.     Breath sounds: Normal breath sounds.  Musculoskeletal:     Cervical back: Neck supple.  Lymphadenopathy:     Cervical: No cervical adenopathy.  Skin:    General: Skin is warm and dry.     Findings: No rash.  Neurological:     General: No focal deficit present.     Mental Status: She is alert and oriented to person, place, and time.  Psychiatric:        Mood and Affect: Mood normal.        Behavior: Behavior normal.     UC Treatments / Results  Labs (all labs ordered are listed, but only abnormal results are displayed) Labs Reviewed - No data to display  EKG   Radiology No results found.  Procedures Procedures (including critical care time)  Medications Ordered in UC Medications - No data to display  Initial Impression / Assessment and Plan / UC Course  I have reviewed the triage vital signs and the nursing notes. Has secondary infection from URI, I believe she is getting maxillary sinusitis. I placed her on Augmentin and Flonase as noted.  She also has oral thrush and I placed her on Nystatin swish as noted.  Final Clinical Impressions(s) / UC Diagnoses   Final diagnoses:  Acute maxillary sinusitis, recurrence not specified  Oral candida     Discharge Instructions      Go to ER if you get worse in the next 2-3 days     ED Prescriptions     Medication Sig Dispense Auth. Provider   amoxicillin-clavulanate (AUGMENTIN) 875-125 MG tablet Take 1 tablet by mouth every 12 (twelve) hours. 20 tablet Rodriguez-Southworth, Nettie Elm, PA-C   fluticasone (FLONASE) 50 MCG/ACT nasal spray Place 2 sprays into both nostrils daily. 16 g Rodriguez-Southworth, Nettie Elm, PA-C   nystatin (MYCOSTATIN)  100000 UNIT/ML suspension Take 5 mLs (500,000 Units total) by mouth 4 (four) times daily. For 7 days for thrush 60 mL Rodriguez-Southworth, Nettie Elm, PA-C      PDMP not reviewed this encounter.   Garey Ham, Cordelia Poche 06/12/21 1132

## 2021-06-12 NOTE — ED Triage Notes (Signed)
Pt states 2 weeks ago husband and pt had flu, husband was in hopsital for being septic. Pt is here with C/O continued chest congestion, sore throat, puffy eyes, headache.

## 2022-01-24 ENCOUNTER — Other Ambulatory Visit: Payer: Self-pay | Admitting: Orthopedic Surgery

## 2022-01-24 ENCOUNTER — Ambulatory Visit
Admission: RE | Admit: 2022-01-24 | Discharge: 2022-01-24 | Disposition: A | Discharge status: Home / Self Care | Source: Ambulatory Visit | Attending: Orthopedic Surgery | Admitting: Orthopedic Surgery

## 2022-01-24 DIAGNOSIS — Z96651 Presence of right artificial knee joint: Secondary | ICD-10-CM | POA: Insufficient documentation

## 2022-01-24 DIAGNOSIS — R2241 Localized swelling, mass and lump, right lower limb: Secondary | ICD-10-CM | POA: Insufficient documentation

## 2022-02-28 ENCOUNTER — Other Ambulatory Visit: Payer: Self-pay | Admitting: Orthopedic Surgery

## 2022-02-28 DIAGNOSIS — M25511 Pain in right shoulder: Secondary | ICD-10-CM

## 2022-03-09 ENCOUNTER — Ambulatory Visit
Admission: RE | Admit: 2022-03-09 | Discharge: 2022-03-09 | Disposition: A | Discharge status: Home / Self Care | Source: Ambulatory Visit | Attending: Orthopedic Surgery | Admitting: Orthopedic Surgery

## 2022-03-09 DIAGNOSIS — M25511 Pain in right shoulder: Secondary | ICD-10-CM | POA: Insufficient documentation

## 2022-05-21 ENCOUNTER — Ambulatory Visit
Admission: EM | Admit: 2022-05-21 | Discharge: 2022-05-21 | Disposition: A | Discharge status: Home / Self Care | Attending: Family Medicine | Admitting: Family Medicine

## 2022-05-21 ENCOUNTER — Encounter: Payer: Self-pay | Admitting: Emergency Medicine

## 2022-05-21 DIAGNOSIS — J988 Other specified respiratory disorders: Secondary | ICD-10-CM | POA: Diagnosis not present

## 2022-05-21 DIAGNOSIS — B370 Candidal stomatitis: Secondary | ICD-10-CM

## 2022-05-21 DIAGNOSIS — H1033 Unspecified acute conjunctivitis, bilateral: Secondary | ICD-10-CM

## 2022-05-21 MED ORDER — MOXIFLOXACIN HCL 0.5 % OP SOLN: 1.0000 [drp] | Freq: Three times a day (TID) | OPHTHALMIC | 0 refills | Status: AC

## 2022-05-21 MED ORDER — AMOXICILLIN-POT CLAVULANATE 875-125 MG PO TABS: 1.0000 | ORAL_TABLET | Freq: Two times a day (BID) | ORAL | 0 refills | Status: AC

## 2022-05-21 MED ORDER — NYSTATIN 100000 UNIT/ML MT SUSP: 5.0000 mL | Freq: Four times a day (QID) | OROMUCOSAL | 0 refills | Status: AC

## 2022-05-21 NOTE — ED Triage Notes (Signed)
Patient c/o sinus pressure, headache, eye irritation, and sore throat for the past 4 days.  Patient denies fevers.

## 2022-05-21 NOTE — Discharge Instructions (Signed)
Medication as prescribed.  Please be reevaluated if you fail to improve or worsen.  Take care  Dr. Adriana Simas

## 2022-05-21 NOTE — ED Provider Notes (Signed)
[facility]    CSN: [id number] Arrival date & time: 05/21/22  1341  History   Chief Complaint Chief Complaint  Patient presents with   Headache   Sore Throat   Sinus Problem    HPI  51 year old female presents with respiratory symptoms.  Patient states that she has been sick over this past week.  She reports sinus pressure and congestion.  Reports associated headache.  Also reports sore throat.  Also reports eye irritation.  She states that her eyes have been experiencing discharge and have been uncomfortable.  He states that her pain is currently 4/10 in severity.  She states that this happens around this time every year.  She is a smoker.  No known history of COPD although her previous x-ray was from years ago.  No fever.  No other associated symptoms.  No other complaints.  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   PTSD (post-traumatic stress disorder) 12/15/2015   Cocaine abuse (HCC) 12/15/2015   Opiate abuse, episodic (HCC) 12/15/2015    Past Surgical History:  Procedure Laterality Date   ABDOMINAL SURGERY     CHOLECYSTECTOMY     KNEE ARTHROSCOPY WITH ANTERIOR CRUCIATE LIGAMENT (ACL) REPAIR Left 12/22/2019   Procedure: RECONSTRUCTION ANTERIOR CRUCIATE LIGAMENT WITH ALLOGRAFT PARTIAL MEDIAL MENISCETOMY PARTIAL SYNOVECTOMY;  Surgeon: Lyndle Herrlich, MD;  Location: [facility] ORS;  Service: Orthopedics;  Laterality: Left;   KNEE SURGERY      OB History     Gravida  4   Para      Term      Preterm      AB  1   Living  2      SAB  1   IAB      Ectopic      Multiple      Live Births               Home Medications    Prior to Admission medications   Medication Sig Start  Date End Date Taking? Authorizing Provider  amoxicillin-clavulanate (AUGMENTIN) 875-125 MG tablet Take 1 tablet by mouth every 12 (twelve) hours. 05/21/22  Yes Joeph Szatkowski G, DO  ARIPiprazole (ABILIFY) 2 MG tablet Take 2 mg by mouth daily.   Yes [provider]  escitalopram (LEXAPRO) 20 MG tablet Take 20 mg by mouth daily.   Yes [provider]  moxifloxacin (VIGAMOX) 0.5 % ophthalmic solution Place 1 drop into both eyes 3 (three) times daily for 5 days. 05/21/22 05/26/22 Yes Omarr Hann G, DO  nystatin (MYCOSTATIN) 100000 UNIT/ML suspension Take 5 mLs (500,000 Units total) by mouth 4 (four) times daily for 7 days. Swish and swallow. 05/21/22 05/28/22 Yes Tommie Sams, DO    Family History History reviewed. No pertinent family history.  Social History Social History   Tobacco Use   Smoking status: Every Day    Packs/day: 1.00    Years: 30.00    Total pack years: 30.00    Types: Cigarettes   Smokeless tobacco: Never  Substance Use Topics   Alcohol use: No   Drug use: Yes    Types: Marijuana    Comment: Has not used in atleast  one year.     Allergies   Patient has no known allergies.   Review of Systems Review of Systems Per HPI  Physical Exam Triage Vital Signs ED Triage Vitals  Enc Vitals Group     BP 05/21/22 1434 133/87     Pulse Rate 05/21/22 1434 84     Resp 05/21/22 1434 14     Temp 05/21/22 1434 98 F (36.7 C)     Temp Source 05/21/22 1434 Oral     SpO2 05/21/22 1434 98 %     Weight 05/21/22 1431 177 lb 14.6 oz (80.7 kg)     Height 05/21/22 1431 5\' 6"  (1.676 m)     Head Circumference --      Peak Flow --      Pain Score 05/21/22 1431 4     Pain Loc --      Pain Edu? --      Excl. in GC? --    Updated Vital Signs BP 133/87 (BP Location: Left Arm)   Pulse 84   Temp 98 F (36.7 C) (Oral)   Resp 14   Ht 5\' 6"  (1.676 m)   Wt 80.7 kg   LMP 02/16/2017 (Approximate)   SpO2 98%   BMI 28.72 kg/m   Visual Acuity Right Eye Distance:    Left Eye Distance:   Bilateral Distance:    Right Eye Near:   Left Eye Near:    Bilateral Near:     Physical Exam Vitals and nursing note reviewed.  Constitutional:      General: She is not in acute distress. HENT:     Head: Normocephalic and atraumatic.     Right Ear: Tympanic membrane normal.     Left Ear: Tympanic membrane normal.     Nose: Congestion present.  Eyes:     Comments: Patient's periorbital area is swollen.  No appreciable eye discharge on exam.  Normal-appearing conjunctiva.   Cardiovascular:     Rate and Rhythm: Normal rate and regular rhythm.  Pulmonary:     Effort: Pulmonary effort is normal.     Breath sounds: Normal breath sounds. No wheezing, rhonchi or rales.  Neurological:     Mental Status: She is alert.  Psychiatric:        Mood and Affect: Mood normal.        Behavior: Behavior normal.      UC Treatments / Results  Labs (all labs ordered are listed, but only abnormal results are displayed) Labs Reviewed - No data to display  EKG   Radiology No results found.  Procedures Procedures (including critical care time)  Medications Ordered in UC Medications - No data to display  Initial Impression / Assessment and Plan / UC Course  I have reviewed the triage vital signs and the nursing notes.  Pertinent labs & imaging results that were available during my care of the patient were reviewed by me and considered in my medical decision making (see chart for details).    51 year old female presents with a respiratory infection.  Has thrush on exam.  Placing on Augmentin, nystatin, and Vigamox.  Final Clinical Impressions(s) / UC Diagnoses   Final diagnoses:  Respiratory infection  Acute conjunctivitis of both eyes, unspecified acute conjunctivitis type  Thrush     Discharge Instructions      Medication as prescribed.  Please be reevaluated if you fail to improve or worsen.  Take care  Dr. 02/18/2017    ED Prescriptions  Medication Sig Dispense Auth. Provider   amoxicillin-clavulanate (AUGMENTIN) 875-125 MG tablet Take 1 tablet by mouth every 12 (twelve) hours. 14 tablet Mauri Temkin G, DO   nystatin (MYCOSTATIN) 100000 UNIT/ML suspension Take 5 mLs (500,000 Units total) by mouth 4 (four) times daily for 7 days. Swish and swallow. 140 mL Luddie Boghosian G, DO   moxifloxacin (VIGAMOX) 0.5 % ophthalmic solution Place 1 drop into both eyes 3 (three) times daily for 5 days. 3 mL Tommie Sams, DO      PDMP not reviewed this encounter.   Tommie Sams, [place] 05/21/22 [phone]

## 2022-06-16 ENCOUNTER — Ambulatory Visit
Admission: EM | Admit: 2022-06-16 | Discharge: 2022-06-16 | Disposition: A | Discharge status: Home / Self Care | Attending: Family Medicine | Admitting: Family Medicine

## 2022-06-16 ENCOUNTER — Ambulatory Visit (INDEPENDENT_AMBULATORY_CARE_PROVIDER_SITE_OTHER)

## 2022-06-16 VITALS — BP 122/79 | HR 86 | Temp 98.5°F | Ht 66.0 in | Wt 147.0 lb

## 2022-06-16 DIAGNOSIS — R059 Cough, unspecified: Secondary | ICD-10-CM | POA: Diagnosis not present

## 2022-06-16 DIAGNOSIS — U071 COVID-19: Secondary | ICD-10-CM

## 2022-06-16 LAB — SARS CORONAVIRUS 2 BY RT PCR: SARS Coronavirus 2 by RT PCR: POSITIVE — AB

## 2022-06-16 NOTE — ED Provider Notes (Signed)
[facility]    CSN: [id number] Arrival date & time: 06/16/22  1431      History   Chief Complaint Chief Complaint  Patient presents with   Chills   Fever   Generalized Body Aches   Headache    HPI Molly Proctor is a 51 y.o. female.   HPI   Molly Proctor presents after her husband tested positive for COVID last night.  She has a productive cough with yellow sputum with associated headache and myalgias for the past 3 days. Reports fatigue. Feels like "my eyelids have weights on it." She has chills and sweats but has not taken her temperature. She took Tylenol and Ibuprofen.     Fever : no  Chills: yes Sore throat: yes Cough: yes Sputum: yes Nasal congestion : yes  Rhinorrhea: no Myalgias: yes Appetite: increased Hydration: normal  Abdominal pain: no Nausea: yes Vomiting: no Diarrhea: no Sleep disturbance: yes Headache: yes Back pain: yes      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   PTSD (post-traumatic stress disorder) 12/15/2015   Cocaine abuse (HCC) 12/15/2015   Opiate abuse, episodic (HCC) 12/15/2015    Past Surgical History:  Procedure Laterality Date   ABDOMINAL SURGERY     CHOLECYSTECTOMY     KNEE ARTHROSCOPY WITH ANTERIOR CRUCIATE LIGAMENT (ACL) REPAIR Left 12/22/2019   Procedure: RECONSTRUCTION ANTERIOR CRUCIATE LIGAMENT WITH ALLOGRAFT PARTIAL MEDIAL MENISCETOMY PARTIAL SYNOVECTOMY;  Surgeon: Lyndle Herrlich, MD;  Location: [facility];  Service: Orthopedics;  Laterality: Left;   KNEE SURGERY      OB History     Gravida  4   Para      Term      Preterm      AB  1   Living  2      SAB  1   IAB      Ectopic      Multiple      Live Births                Home Medications    Prior to Admission medications   Medication Sig Start Date End Date Taking? Authorizing Provider  ARIPiprazole (ABILIFY) 2 MG tablet Take 2 mg by mouth daily.   Yes [provider]  escitalopram (LEXAPRO) 20 MG tablet Take 20 mg by mouth daily.   Yes [provider]  amoxicillin-clavulanate (AUGMENTIN) 875-125 MG tablet Take 1 tablet by mouth every 12 (twelve) hours. 05/21/22   Tommie Sams, DO    Family History History reviewed. No pertinent family history.  Social History Social History   Tobacco Use   Smoking status: Every Day    Packs/day: 1.00    Years: 30.00    Total pack years: 30.00    Types: Cigarettes   Smokeless tobacco: Never  Substance Use Topics   Alcohol use: No   Drug use: Yes    Types: Marijuana    Comment: Has not used in atleast one year.     Allergies   Patient has no known allergies.   Review of Systems Review of Systems: :negative unless otherwise stated in HPI.      Physical Exam Triage Vital  Signs ED Triage Vitals  Enc Vitals Group     BP 06/16/22 1440 122/79     Pulse Rate 06/16/22 1440 86     Resp --      Temp 06/16/22 1440 98.5 F (36.9 C)     Temp src --      SpO2 06/16/22 1440 99 %     Weight 06/16/22 1437 147 lb (66.7 kg)     Height 06/16/22 1437 5\' 6"  (1.676 m)     Head Circumference --      Peak Flow --      Pain Score 06/16/22 1437 4     Pain Loc --      Pain Edu? --      Excl. in GC? --    No data found.  Updated Vital Signs BP 122/79 (BP Location: Left Arm)   Pulse 86   Temp 98.5 F (36.9 C)   Ht 5\' 6"  (1.676 m)   Wt 66.7 kg   LMP 02/16/2017 (Approximate)   SpO2 99%   BMI 23.73 kg/m   Visual Acuity Right Eye Distance:   Left Eye Distance:   Bilateral Distance:    Right Eye Near:   Left Eye Near:    Bilateral Near:     Physical Exam GEN:     alert, non-toxic appearing female in no distress    HENT:  mucus membranes moist, oropharyngeal without lesions or  exudate, no tonsillar hypertrophy, mild erythema ,  moderate turbinate hypertrophy, clear nasal discharge, bilateral TM normal EYES:   pupils equal and reactive, no scleral injection NECK:  normal ROM RESP:  no increased work of breathing, clear to auscultation bilaterally CVS:   regular rate and rhythm Skin:   warm and dry, no rash on visible skin    UC Treatments / Results  Labs (all labs ordered are listed, but only abnormal results are displayed) Labs Reviewed  SARS CORONAVIRUS 2 BY RT PCR - Abnormal; Notable for the following components:      Result Value   SARS Coronavirus 2 by RT PCR POSITIVE (*)    All other components within normal limits    EKG   Radiology DG Chest 2 View  Result Date: 06/16/2022 CLINICAL DATA:  Cough. EXAM: CHEST - 2 VIEW COMPARISON:  Chest x-ray 02/24/2016 FINDINGS: The heart size and mediastinal contours are within normal limits. Both lungs are clear. The visualized skeletal structures are unremarkable. IMPRESSION: No active cardiopulmonary disease. Electronically Signed   By: 06/18/2022 M.D.   On: 06/16/2022 15:05    Procedures Procedures (including critical care time)  Medications Ordered in UC Medications - No data to display  Initial Impression / Assessment and Plan / UC Course  I have reviewed the triage vital signs and the nursing notes.  Pertinent labs & imaging results that were available during my care of the patient were reviewed by me and considered in my medical decision making (see chart for details).     COVID exposure Patient is a 51 y.o. female who presents after her husband tested positive yesterday.  Her symptoms started about 72 hours ago  Overall she non-toxic-appearing, well-hydrated and in no respiratory distress. VSS.  Discussed symptomatic treatment.  Her cough bothers her the most.  Chest x-ray was obtained and personally reviewed by me and was were unremarkable for focal pneumonia, pleural effusion or pneumothorax.   She is  interested in treatment for COVID however results of the COVID treatment window.  Quarantine  instructions provided.  ED and return precautions and understanding voiced. Discussed MDM, treatment plan and plan for follow-up with patient/parent who agrees with plan.     Final Clinical Impressions(s) / UC Diagnoses   Final diagnoses:  COVID-19     Discharge Instructions      Your test for COVID-19 was positive, meaning that you were infected with the novel coronavirus and could give the germ to others.  Please continue isolation at home for at least 5 days since the start of your symptoms. Once you complete your 5 day quarantine, you may return to normal activities as long as you've not had a fever for over 24 hours(without taking fever reducing medicine) and your symptoms are improving. Be sure to wear a mask until Day 11.   Please continue good preventive care measures, including:  frequent hand-washing, avoid touching your face, cover coughs/sneezes, stay out of crowds and keep a 6 foot distance from others.  Go to the nearest hospital emergency room if fever/cough/breathlessness are severe or illness seems like a threat to life.      ED Prescriptions   None    PDMP not reviewed this encounter.   Katha Cabal, DO 06/16/22 2107

## 2022-06-16 NOTE — ED Triage Notes (Signed)
Patient reports that she was exposed to COVID. Her husband tested positive last night.   Patient reports that her symptoms stated about 3 days ago -- chills, body aches, fever, headaches.

## 2022-06-16 NOTE — Discharge Instructions (Signed)
Your test for COVID-19 was positive, meaning that you were infected with the novel coronavirus and could give the germ to others.  Please continue isolation at home for at least 5 days since the start of your symptoms. Once you complete your 5 day quarantine, you may return to normal activities as long as you've not had a fever for over 24 hours(without taking fever reducing medicine) and your symptoms are improving. Be sure to wear a mask until Day 11.   Please continue good preventive care measures, including:  frequent hand-washing, avoid touching your face, cover coughs/sneezes, stay out of crowds and keep a 6 foot distance from others.  Go to the nearest hospital emergency room if fever/cough/breathlessness are severe or illness seems like a threat to life.  

## 2023-08-23 IMAGING — US US EXTREM LOW VENOUS*R*
1 series · 13 of 24 positions shown · non-contrast
Comparison: None.

CLINICAL DATA: 50-year-old female with history of right lower
extremity edema, prior right knee arthroplasty.

EXAM:
RIGHT LOWER EXTREMITY VENOUS DOPPLER ULTRASOUND
TECHNIQUE: Gray-scale sonography with graded compression, as well as color
Doppler and duplex ultrasound were performed to evaluate the right
lower extremity deep venous systems from the level of the common
femoral vein and including the common femoral, femoral, profunda
femoral, popliteal and calf veins including the posterior tibial,
peroneal and gastrocnemius veins when visible. Spectral Doppler was
utilized to evaluate flow at rest and with distal augmentation
maneuvers in the common femoral, femoral and popliteal veins. The
contralateral common femoral vein was also evaluated for comparison.

[Series 1: us venous img lower uni right (dvt) · portal-venous · 13 of 42 slices shown]
[im 1/42]
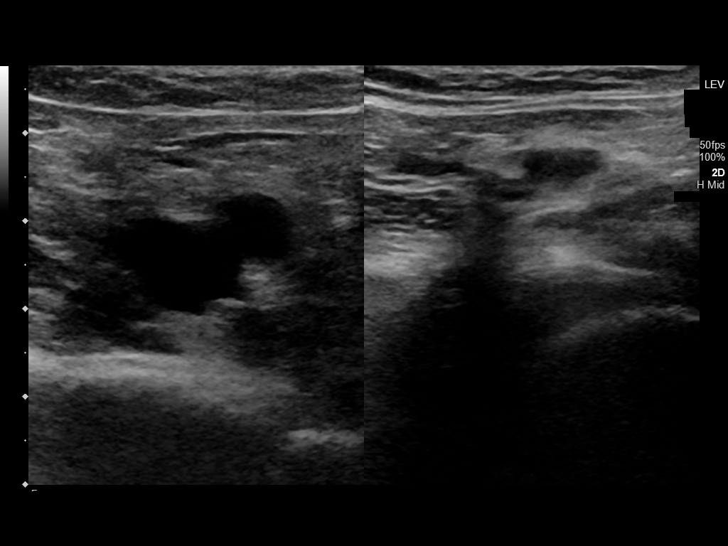
[im 4/42]
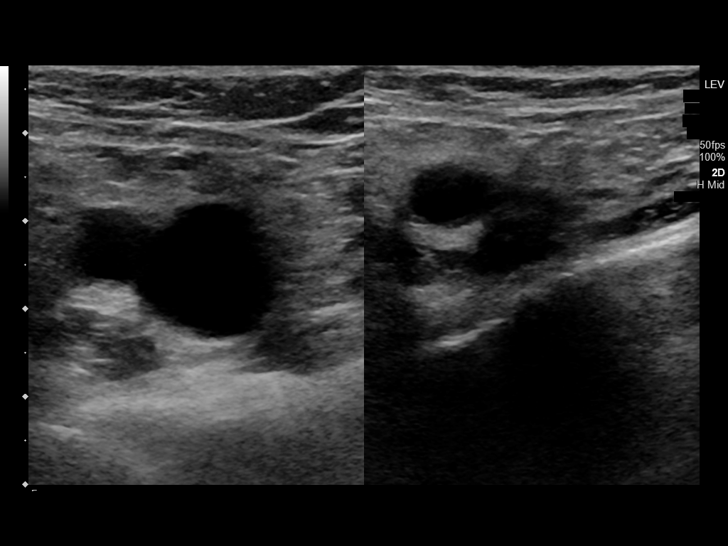
[im 8/42]
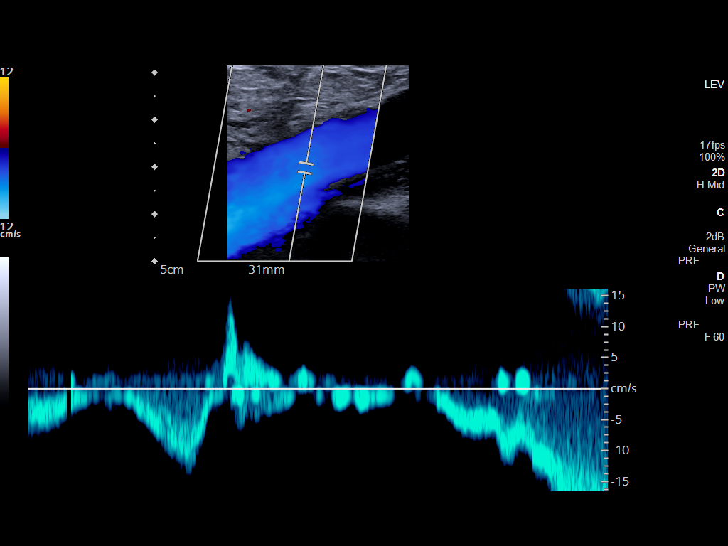
[im 11/42]
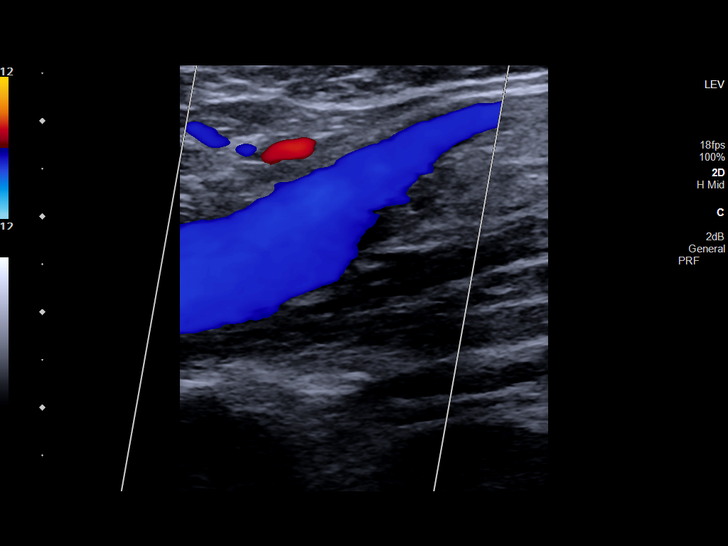
[im 15/42]
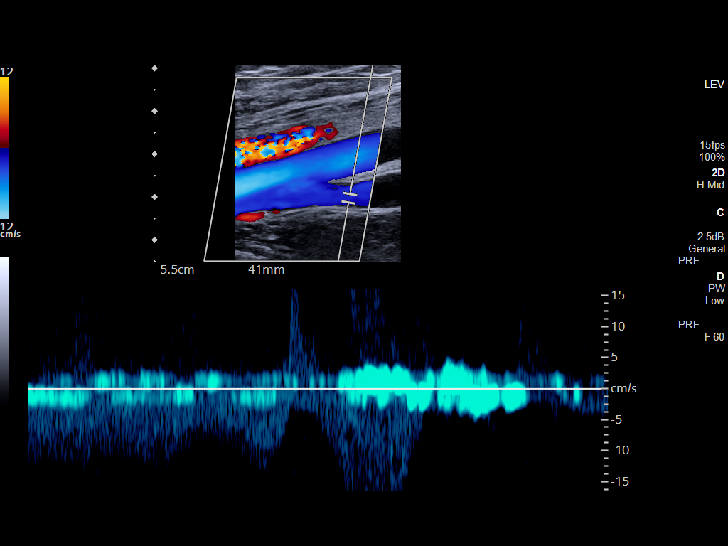
[im 18/42]
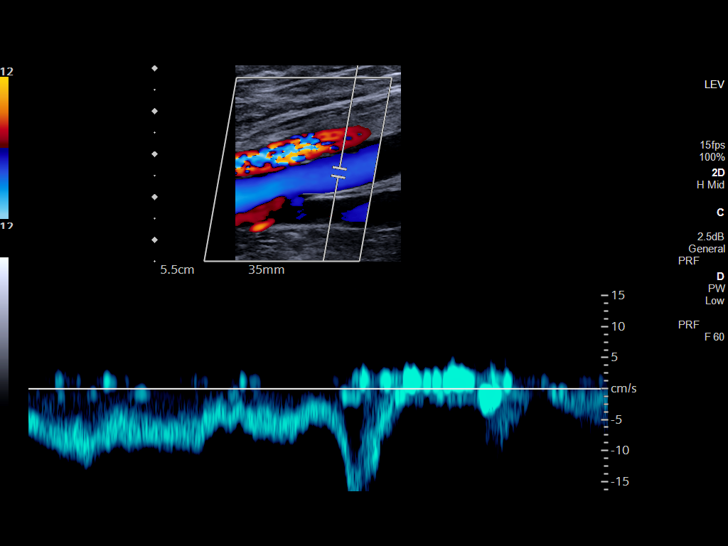
[im 22/42]
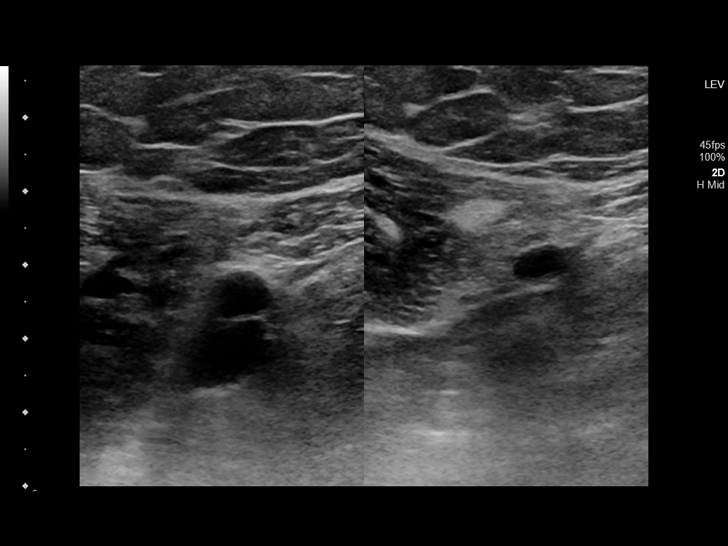
[im 24/42]
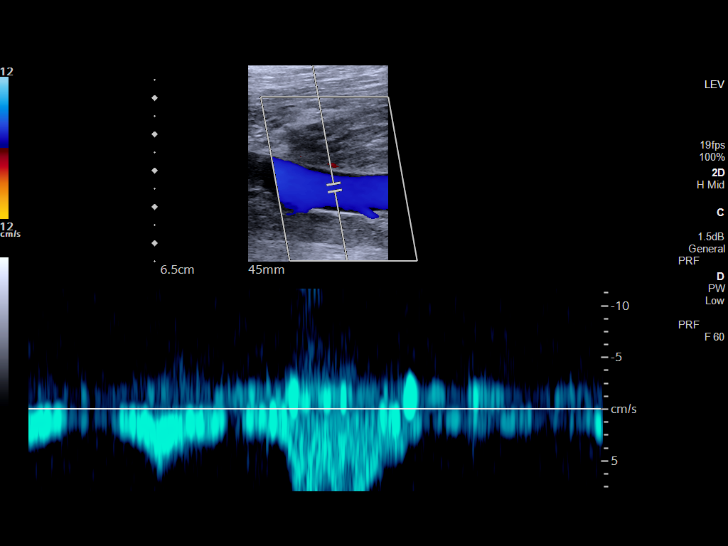
[im 27/42]
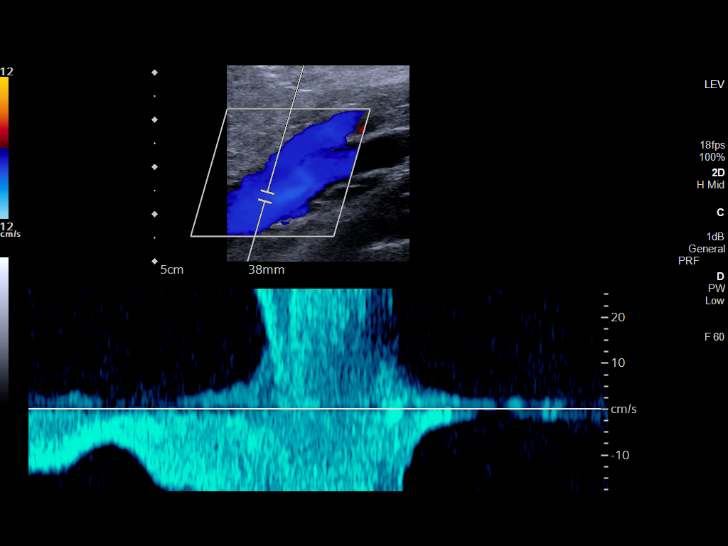
[im 31/42]
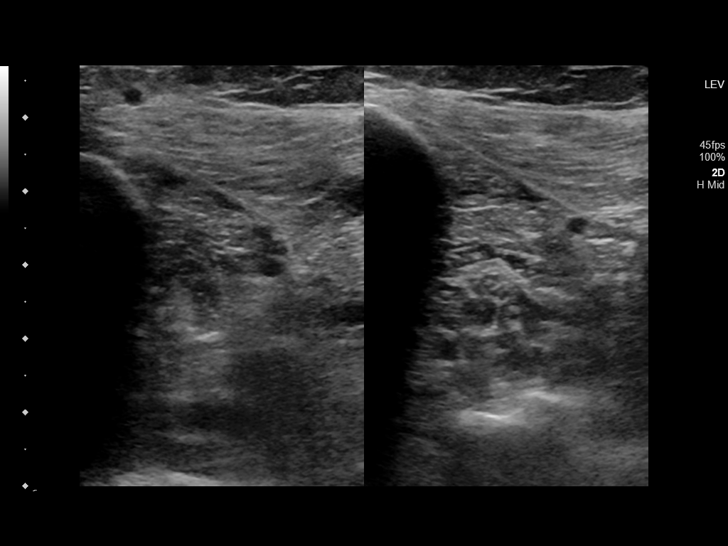
[im 34/42]
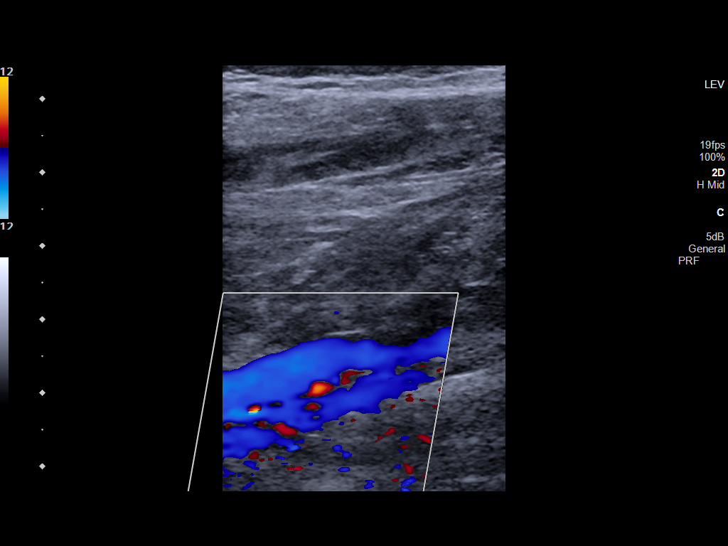
[im 38/42]
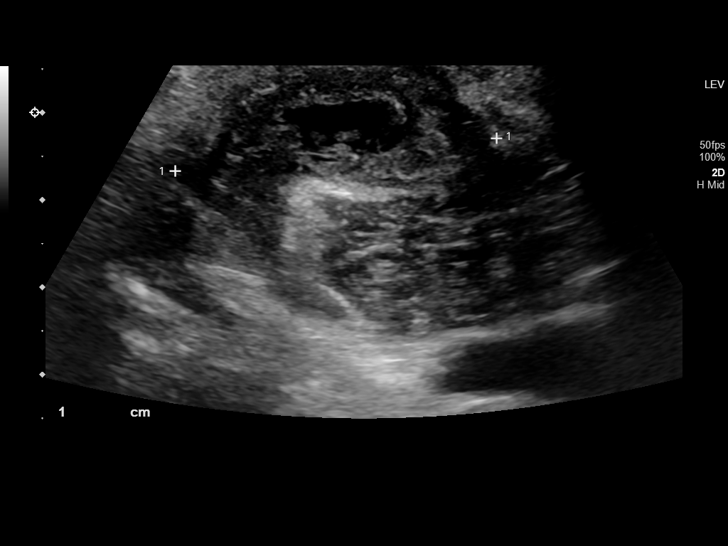
[im 42/42]
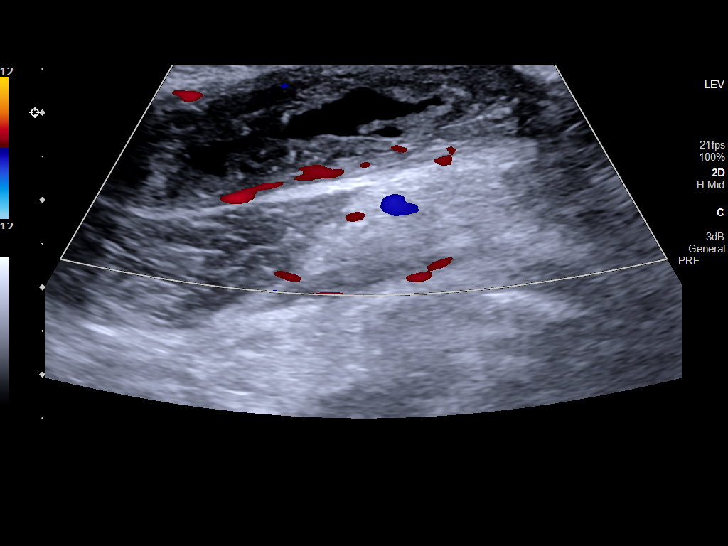

[13 of 24 positions shown; findings below may reference images not displayed]

FINDINGS: RIGHT LOWER EXTREMITY

Common Femoral Vein: No evidence of thrombus. Normal
compressibility, respiratory phasicity and response to augmentation.

Central Greater Saphenous Vein: No evidence of thrombus. Normal
compressibility and flow on color Doppler imaging.

Central Profunda Femoral Vein: No evidence of thrombus. Normal
compressibility and flow on color Doppler imaging.

Femoral Vein: No evidence of thrombus. Normal compressibility,
respiratory phasicity and response to augmentation.

Popliteal Vein: No evidence of thrombus. Normal compressibility,
respiratory phasicity and response to augmentation.

Calf Veins: No evidence of thrombus. Normal compressibility and flow
on color Doppler imaging.

Other Findings: Moderately complex, heterogeneously hypoechoic ovoid
structure in the right popliteal fossa measuring approximately 5.2 x
1.4 x 3.7 cm.

LEFT LOWER EXTREMITY

Common Femoral Vein: No evidence of thrombus. Normal
compressibility, respiratory phasicity and response to augmentation.
IMPRESSION: 1. No evidence of right lower extremity deep venous thrombosis.
2. Complex right Baker cyst measuring up to 5.2 cm.

## 2024-01-02 ENCOUNTER — Inpatient Hospital Stay: Admission: RE | Admit: 2024-01-02 | Source: Ambulatory Visit
